# Patient Record
Sex: Female | Born: 1985 | Race: Black or African American | Hispanic: No | Marital: Single | Smoking: Former smoker
Health system: Southern US, Community
[De-identification: ages and names within clinical notes are randomized; demographics above are authoritative.]

## PROBLEM LIST (undated history)

## (undated) ENCOUNTER — Inpatient Hospital Stay (HOSPITAL_COMMUNITY): Payer: Self-pay

## (undated) DIAGNOSIS — I10 Essential (primary) hypertension: Secondary | ICD-10-CM

## (undated) DIAGNOSIS — O24419 Gestational diabetes mellitus in pregnancy, unspecified control: Secondary | ICD-10-CM

## (undated) HISTORY — PX: NO PAST SURGERIES: SHX2092

---

## 2017-05-29 ENCOUNTER — Emergency Department (HOSPITAL_COMMUNITY)
Admission: EM | Admit: 2017-05-29 | Discharge: 2017-05-29 | Disposition: A | Discharge status: Home / Self Care | Payer: BLUE CROSS/BLUE SHIELD | Attending: Emergency Medicine | Admitting: Emergency Medicine

## 2017-05-29 ENCOUNTER — Encounter (HOSPITAL_COMMUNITY): Payer: Self-pay | Admitting: Emergency Medicine

## 2017-05-29 DIAGNOSIS — F17200 Nicotine dependence, unspecified, uncomplicated: Secondary | ICD-10-CM | POA: Insufficient documentation

## 2017-05-29 DIAGNOSIS — Z3201 Encounter for pregnancy test, result positive: Secondary | ICD-10-CM | POA: Diagnosis present

## 2017-05-29 DIAGNOSIS — Z3490 Encounter for supervision of normal pregnancy, unspecified, unspecified trimester: Secondary | ICD-10-CM

## 2017-05-29 LAB — POC URINE PREG, ED: PREG TEST UR: POSITIVE — AB

## 2017-05-29 MED ORDER — PRENATAL COMPLETE 14-0.4 MG PO TABS: 0.4000 mg | ORAL_TABLET | Freq: Every day | ORAL | 0 refills | Status: AC

## 2017-05-29 NOTE — ED Triage Notes (Signed)
Pt sts positive home pregnancy test x 2; pt sts unsure of last period and hx of pregnancy x 1 in past with miscarriage; pt denies any other complaints

## 2017-05-29 NOTE — ED Provider Notes (Signed)
[facility] Provider Note   CSN: [id number] Arrival date & time: 05/29/17  [id number]     History   Chief Complaint Chief Complaint  Patient presents with  . Possible Pregnancy    HPI Kathryn Wilkins is a 31 y.o. female.  HPI  31 y.o. female, presents to the Emergency Department today requesting pregnancy test. Pt has had 2 home pregnancy tests. Noted hx in past with miscarriage last year. Denies N/V. No abdominal pain. No CP/SOB. No headache. No fevers. No visual changes. Unsure of LMP due to having irregular cycles. Pt states that she thinks her last menstrual was in April. No other symptoms noted.    History reviewed. No pertinent past medical history.  There are no active problems to display for this patient.   History reviewed. No pertinent surgical history.  OB History    No data available       Home Medications    Prior to Admission medications   Not on File    Family History History reviewed. No pertinent family history.  Social History Social History  Substance Use Topics  . Smoking status: Current Every Day Smoker  . Smokeless tobacco: Never Used  . Alcohol use No     Allergies   Patient has no known allergies.   Review of Systems Review of Systems ROS reviewed and all are negative for acute change except as noted in the HPI.  Physical Exam Updated Vital Signs BP 134/78 (BP Location: Right Arm)   Pulse 69   Temp 98.2 F (36.8 C) (Oral)   Resp 18   SpO2 100%   Physical Exam  Constitutional: She is oriented to person, place, and time. Vital signs are normal. She appears well-developed and well-nourished.  HENT:  Head: Normocephalic and atraumatic.  Right Ear: Hearing normal.  Left Ear: Hearing normal.  Eyes: Conjunctivae and EOM are normal. Pupils are equal, round, and reactive to light.  Neck: Normal range of motion. Neck supple.  Cardiovascular: Normal rate, regular rhythm, normal heart sounds and intact distal pulses.     Pulmonary/Chest: Effort normal and breath sounds normal.  Abdominal: Soft. There is no tenderness.  Abdomen soft/non tender. No palpable fundus  Musculoskeletal: Normal range of motion.  Neurological: She is alert and oriented to person, place, and time.  Skin: Skin is warm and dry.  Psychiatric: She has a normal mood and affect. Her speech is normal and behavior is normal. Thought content normal.  Nursing note and vitals reviewed.  ED Treatments / Results  Labs (all labs ordered are listed, but only abnormal results are displayed) Labs Reviewed  POC URINE PREG, ED - Abnormal; Notable for the following:       Result Value   Preg Test, Ur POSITIVE (*)    All other components within normal limits    EKG  EKG Interpretation None       Radiology No results found.  Procedures Procedures (including critical care time)  Medications Ordered in ED Medications - No data to display   Initial Impression / Assessment and Plan / ED Course  I have reviewed the triage vital signs and the nursing notes.  Pertinent labs & imaging results that were available during my care of the patient were reviewed by me and considered in my medical decision making (see chart for details).  Final Clinical Impressions(s) / ED Diagnoses  {I have reviewed and evaluated the relevant laboratory values.   {I have reviewed the relevant previous healthcare records.  {  I obtained HPI from historian. {Patient discussed with supervising physician.  ED Course:  Assessment: Pt is a 31 y.o. female with hx miscarriage who presents requesting pregnancy test. Noted positive 2 home pregnancy tests. No abdominal pain. No vaginal bleeding/discharge. On exam, pt in NAD. Nontoxic/nonseptic appearing. VSS. Afebrile. Lungs CTA. Heart RRR. Abdomen nontender soft. iStat HCG positive. Unsure of LMP due to irregular cycles. Pt thinks last cycle in April with some spotting. Discussed with attending provider. Will DC home with  follow up to OBGYN. As pt without abdominal pain and vaginal bleeding or symptoms, will have outpatient US done with OB to confirm gestation. Given Rx prenatal vitamins. At time of discharge, Patient is in no acute distress. Vital Signs are stable. Patient is able to ambulate. Patient able to tolerate PO.   Disposition/Plan:  DC Home Additional Verbal discharge instructions given and discussed with patient.  Pt Instructed to f/u with OBGYN in the next week for evaluation and treatment of symptoms. Return precautions given Pt acknowledges and agrees with plan  Supervising Physician Eber Hong, MD  Final diagnoses:  Pregnancy, unspecified gestational age    New Prescriptions New Prescriptions   No medications on file     Audry Pili, Cordelia Poche 05/29/17 1610    Eber Hong, MD 05/31/17 [id number]

## 2017-05-29 NOTE — Discharge Instructions (Signed)
Please read and follow all provided instructions.  Your diagnoses today include:  1. Pregnancy, unspecified gestational age     Tests performed today include: Vital signs. See below for your results today.   Medications prescribed:  Take as prescribed   Home care instructions:  Follow any educational materials contained in this packet.  Follow-up instructions: Please follow-up with OBGYN for further evaluation of symptoms and treatment   Return instructions:  Please return to the Emergency Department if you do not get better, if you get worse, or new symptoms OR  - Fever (temperature greater than 101.10F)  - Bleeding that does not stop with holding pressure to the area    -Severe pain (please note that you may be more sore the day after your accident)  - Chest Pain  - Difficulty breathing  - Severe nausea or vomiting  - Inability to tolerate food and liquids  - Passing out  - Skin becoming red around your wounds  - Change in mental status (confusion or lethargy)  - New numbness or weakness    Please return if you have any other emergent concerns.  Additional Information:  Your vital signs today were: BP 134/78 (BP Location: Right Arm)    Pulse 69    Temp 98.2 F (36.8 C) (Oral)    Resp 18    SpO2 100%  If your blood pressure (BP) was elevated above 135/85 this visit, please have this repeated by your doctor within one month. ---------------

## 2017-07-05 ENCOUNTER — Emergency Department (HOSPITAL_COMMUNITY)
Admission: EM | Admit: 2017-07-05 | Discharge: 2017-07-05 | Disposition: A | Discharge status: Home / Self Care | Payer: Self-pay | Attending: Emergency Medicine | Admitting: Emergency Medicine

## 2017-07-05 ENCOUNTER — Encounter (HOSPITAL_COMMUNITY): Payer: Self-pay

## 2017-07-05 ENCOUNTER — Emergency Department (HOSPITAL_COMMUNITY): Payer: Self-pay

## 2017-07-05 DIAGNOSIS — O0001 Abdominal pregnancy with intrauterine pregnancy: Secondary | ICD-10-CM | POA: Insufficient documentation

## 2017-07-05 DIAGNOSIS — R102 Pelvic and perineal pain: Secondary | ICD-10-CM

## 2017-07-05 DIAGNOSIS — F17210 Nicotine dependence, cigarettes, uncomplicated: Secondary | ICD-10-CM | POA: Insufficient documentation

## 2017-07-05 DIAGNOSIS — Z3A12 12 weeks gestation of pregnancy: Secondary | ICD-10-CM | POA: Insufficient documentation

## 2017-07-05 DIAGNOSIS — R1030 Lower abdominal pain, unspecified: Secondary | ICD-10-CM | POA: Insufficient documentation

## 2017-07-05 DIAGNOSIS — O99012 Anemia complicating pregnancy, second trimester: Secondary | ICD-10-CM | POA: Insufficient documentation

## 2017-07-05 LAB — WET PREP, GENITAL
Clue Cells Wet Prep HPF POC: NONE SEEN
Sperm: NONE SEEN
Trich, Wet Prep: NONE SEEN
WBC, Wet Prep HPF POC: NONE SEEN
Yeast Wet Prep HPF POC: NONE SEEN

## 2017-07-05 LAB — COMPREHENSIVE METABOLIC PANEL
ALT: 17 U/L (ref 14–54)
ANION GAP: 9 (ref 5–15)
AST: 21 U/L (ref 15–41)
Albumin: 3.5 g/dL (ref 3.5–5.0)
Alkaline Phosphatase: 55 U/L (ref 38–126)
BILIRUBIN TOTAL: 0.3 mg/dL (ref 0.3–1.2)
BUN: <5 mg/dL — ABNORMAL LOW (ref 6–20)
CHLORIDE: 105 mmol/L (ref 101–111)
CO2: 20 mmol/L — ABNORMAL LOW (ref 22–32)
Calcium: 9.4 mg/dL (ref 8.9–10.3)
Creatinine, Ser: 0.66 mg/dL (ref 0.44–1.00)
GFR calc Af Amer: >60 mL/min (ref >60)
Glucose, Bld: 118 mg/dL — ABNORMAL HIGH (ref 65–99)
POTASSIUM: 3.7 mmol/L (ref 3.5–5.1)
Sodium: 134 mmol/L — ABNORMAL LOW (ref 135–145)
TOTAL PROTEIN: 7.1 g/dL (ref 6.5–8.1)

## 2017-07-05 LAB — HCG, QUANTITATIVE, PREGNANCY: hCG, Beta Chain, Quant, S: 164690 m[IU]/mL — ABNORMAL HIGH (ref <5)

## 2017-07-05 LAB — CBC
HEMATOCRIT: 34.7 % — AB (ref 36.0–46.0)
Hemoglobin: 11.5 g/dL — ABNORMAL LOW (ref 12.0–15.0)
MCH: 21.5 pg — ABNORMAL LOW (ref 26.0–34.0)
MCHC: 33.1 g/dL (ref 30.0–36.0)
MCV: 64.7 fL — AB (ref 78.0–100.0)
Platelets: 240 10*3/uL (ref 150–400)
RBC: 5.36 MIL/uL — ABNORMAL HIGH (ref 3.87–5.11)
RDW: 17.4 % — AB (ref 11.5–15.5)
WBC: 10.1 10*3/uL (ref 4.0–10.5)

## 2017-07-05 LAB — URINALYSIS, ROUTINE W REFLEX MICROSCOPIC
Bilirubin Urine: NEGATIVE
GLUCOSE, UA: NEGATIVE mg/dL
Hgb urine dipstick: NEGATIVE
KETONES UR: 20 mg/dL — AB
Leukocytes, UA: NEGATIVE
NITRITE: NEGATIVE
PH: 7 (ref 5.0–8.0)
Protein, ur: NEGATIVE mg/dL
SPECIFIC GRAVITY, URINE: 1.015 (ref 1.005–1.030)

## 2017-07-05 LAB — OB RESULTS CONSOLE GC/CHLAMYDIA: GC PROBE AMP, GENITAL: NEGATIVE

## 2017-07-05 LAB — LIPASE, BLOOD: LIPASE: 19 U/L (ref 11–51)

## 2017-07-05 LAB — PREGNANCY, URINE: Preg Test, Ur: POSITIVE — AB

## 2017-07-05 MED ORDER — ACETAMINOPHEN 325 MG PO TABS
650.0000 mg | ORAL_TABLET | Freq: Once | ORAL | Status: DC
  Administered 2017-07-05: 650 mg via ORAL
  Filled 2017-07-05: qty 2

## 2017-07-05 NOTE — Discharge Instructions (Signed)
You can take Tylenol as prescribed over-the-counter, as needed for your pain. Continue taking your prenatal vitamins. Sure to stay well-hydrated and drink plenty of water. Please follow-up with the OB/GYN as soon as possible for further evaluation and treatment. Please return to the emergency department or [facility] if you develop any new or worsening symptoms.

## 2017-07-05 NOTE — ED Triage Notes (Signed)
Pt reports she is currently pregnant, LMP she cant remember. She estimates in April. She found out she was pregnant in June. Pt has not idea how far along she is and has not yet been to the OB. She reports pelvic pain and pressure that started this morning and reports "dripping" when she feels like she has to urinate. This is her first pregnancy. Denies bleeding, denies back pain.

## 2017-07-05 NOTE — ED Notes (Signed)
Pt in bathroom x 2, to try and get urine. Has been dribbling urine in bed.

## 2017-07-05 NOTE — ED Notes (Signed)
Pt to US.

## 2017-07-05 NOTE — ED Provider Notes (Signed)
[facility] Provider Note   CSN: [id number] Arrival date & time: 07/05/17  1121     History   Chief Complaint Chief Complaint  Patient presents with  . Pelvic Pain    HPI Kathryn Wilkins is a 31 y.o. female who presents with suprapubic pressure and pain that began this morning. Patient feels a pressure in her bladder. She was able to urinate without difficulty earlier today. She denied any dysuria. She has had urgency. Patient is previously healthy and pregnant with LMP around April. Patient was diagnosed with U preg here in June. Patient's menstrual cycle is irregular and she cannot be sure of last menstrual cycle. She has not seen OB/GYN yet. She denies any abnormal vaginal bleeding or discharge. She denies any other abdominal pain, nausea, vomiting, fevers, chest pain, shortness of breath. She did not take any medications prior to arrival.  HPI  History reviewed. No pertinent past medical history.  There are no active problems to display for this patient.   History reviewed. No pertinent surgical history.  OB History    Gravida Para Term Preterm AB Living   1             SAB TAB Ectopic Multiple Live Births                   Home Medications    Prior to Admission medications   Medication Sig Start Date End Date Taking? Authorizing Provider  Prenatal Vit-Fe Fumarate-FA (PRENATAL COMPLETE) 14-0.4 MG TABS Take 0.4 mg by mouth daily. 05/29/17  Yes Audry Pili, PA-C    Family History No family history on file.  Social History Social History  Substance Use Topics  . Smoking status: Current Every Day Smoker  . Smokeless tobacco: Never Used  . Alcohol use No     Allergies   Patient has no known allergies.   Review of Systems Review of Systems  Constitutional: Negative for chills and fever.  HENT: Negative for facial swelling and sore throat.   Respiratory: Negative for shortness of breath.   Cardiovascular: Negative for chest pain.  Gastrointestinal:  Positive for abdominal pain. Negative for nausea and vomiting.  Genitourinary: Positive for pelvic pain. Negative for dysuria, vaginal bleeding and vaginal discharge.  Musculoskeletal: Negative for back pain.  Skin: Negative for rash and wound.  Neurological: Negative for headaches.  Psychiatric/Behavioral: The patient is not nervous/anxious.      Physical Exam Updated Vital Signs BP 124/63   Pulse 70   Temp 98.7 F (37.1 C)   Resp 16   LMP 04/05/2017   SpO2 98%   Physical Exam  Constitutional: She appears well-developed and well-nourished. No distress.  HENT:  Head: Normocephalic and atraumatic.  Mouth/Throat: Oropharynx is clear and moist. No oropharyngeal exudate.  Eyes: Pupils are equal, round, and reactive to light. Conjunctivae are normal. Right eye exhibits no discharge. Left eye exhibits no discharge. No scleral icterus.  Neck: Normal range of motion. Neck supple. No thyromegaly present.  Cardiovascular: Normal rate, regular rhythm, normal heart sounds and intact distal pulses.  Exam reveals no gallop and no friction rub.   No murmur heard. Pulmonary/Chest: Effort normal and breath sounds normal. No stridor. No respiratory distress. She has no wheezes. She has no rales.  Abdominal: Soft. Bowel sounds are normal. She exhibits no distension. There is tenderness in the suprapubic area. There is no rebound and no guarding.  Genitourinary:  Genitourinary Comments: Chaperone present; limited speculum exam; no significant discharge; no adnexal  tenderness or bleeding; some possible vaginal prolapse felt on posterior aspect  Musculoskeletal: She exhibits no edema.  Lymphadenopathy:    She has no cervical adenopathy.  Neurological: She is alert. Coordination normal.  Skin: Skin is warm and dry. No rash noted. She is not diaphoretic. No pallor.  Psychiatric: She has a normal mood and affect.  Nursing note and vitals reviewed.    ED Treatments / Results  Labs (all labs ordered  are listed, but only abnormal results are displayed) Labs Reviewed  COMPREHENSIVE METABOLIC PANEL - Abnormal; Notable for the following:       Result Value   Sodium 134 (*)    CO2 20 (*)    Glucose, Bld 118 (*)    BUN <5 (*)    All other components within normal limits  CBC - Abnormal; Notable for the following:    RBC 5.36 (*)    Hemoglobin 11.5 (*)    HCT 34.7 (*)    MCV 64.7 (*)    MCH 21.5 (*)    RDW 17.4 (*)    All other components within normal limits  URINALYSIS, ROUTINE W REFLEX MICROSCOPIC - Abnormal; Notable for the following:    APPearance HAZY (*)    Ketones, ur 20 (*)    All other components within normal limits  HCG, QUANTITATIVE, PREGNANCY - Abnormal; Notable for the following:    hCG, Beta Chain, Quant, S 164,690 (*)    All other components within normal limits  PREGNANCY, URINE - Abnormal; Notable for the following:    Preg Test, Ur POSITIVE (*)    All other components within normal limits  WET PREP, GENITAL  LIPASE, BLOOD  GC/CHLAMYDIA PROBE AMP ([place]) NOT AT [facility]    EKG  EKG Interpretation None       Radiology US Ob Comp < 14 Wks  Result Date: 07/05/2017 CLINICAL DATA:  Bladder fullness. EXAM: OBSTETRIC <14 WK [place] AND TRANSVAGINAL OB US TECHNIQUE: Both transabdominal and transvaginal ultrasound examinations were performed for complete evaluation of the gestation as well as the maternal uterus, adnexal regions, and pelvic cul-de-sac. Transvaginal technique was performed to assess early pregnancy. COMPARISON:  No prior . FINDINGS: Intrauterine gestational sac: Single Yolk sac:  Not visualized Embryo:  Visualized Cardiac Activity: Visualized Heart Rate: 135  bpm CRL:  63.8  mm   12 w   5 d                  [place] EDC: 01/12/2018 Subchorionic hemorrhage:  None visualized. Maternal uterus/adnexae: 2 cm complex cyst, most likely corpus luteal. IMPRESSION: Single viable intrauterine pregnancy at 12 weeks 5 days. Electronically Signed   By: Maisie Fus  Register    On: 07/05/2017 16:12   US Ob Transvaginal  Result Date: 07/05/2017 CLINICAL DATA:  Bladder fullness. EXAM: OBSTETRIC <14 WK [place] AND TRANSVAGINAL OB US TECHNIQUE: Both transabdominal and transvaginal ultrasound examinations were performed for complete evaluation of the gestation as well as the maternal uterus, adnexal regions, and pelvic cul-de-sac. Transvaginal technique was performed to assess early pregnancy. COMPARISON:  No prior . FINDINGS: Intrauterine gestational sac: Single Yolk sac:  Not visualized Embryo:  Visualized Cardiac Activity: Visualized Heart Rate: 135  bpm CRL:  63.8  mm   12 w   5 d                  [place] EDC: 01/12/2018 Subchorionic hemorrhage:  None visualized. Maternal uterus/adnexae: 2 cm complex cyst, most likely corpus luteal. IMPRESSION:  Single viable intrauterine pregnancy at 12 weeks 5 days. Electronically Signed   By: Maisie Fushomas  Register   On: 07/05/2017 16:12    Procedures Procedures (including critical care time)  Medications Ordered in ED Medications - No data to display   Initial Impression / Assessment and Plan / ED Course  I have reviewed the triage vital signs and the nursing notes.  Pertinent labs & imaging results that were available during my care of the patient were reviewed by me and considered in my medical decision making (see chart for details).     CBC shows mild anemia, hemoglobin 11.5. CMP unremarkable. Lipase 19. HCG 164,690. UA is negative for infection. Wet prep is negative. Pelvic ultrasound shows single viable intrauterine pregnancy at 12 weeks, 5 days. Patient's symptoms improved in the ED without intervention. She reports continued, mild intermittent pains and pressure. Patient to follow-up with OB/GYN at scheduled appointment in one week or sooner, if possible. Return precautions discussed. Patient understands and agrees with plan. Patient vitals stable throughout ED course and discharged in satisfactory condition. I discussed patient case with  Dr. Clarene DukeLittle who guided the patient's management and agrees with plan.   Final Clinical Impressions(s) / ED Diagnoses   Final diagnoses:  Suprapubic pressure    New Prescriptions New Prescriptions   No medications on file     Verdis PrimeLaw, Mckayla Mulcahey M, PA-C 07/05/17 1624    Little, Ambrose Finlandachel Morgan, MD 07/06/17 1733

## 2017-07-05 NOTE — ED Notes (Signed)
Called about hcg-quant technician reports in process, will check on it and expedite results.

## 2017-07-06 LAB — GC/CHLAMYDIA PROBE AMP (~~LOC~~) NOT AT ARMC
Chlamydia: NEGATIVE
Neisseria Gonorrhea: NEGATIVE

## 2017-07-11 LAB — URINE CULTURE

## 2017-07-11 LAB — OB RESULTS CONSOLE RPR: RPR: NONREACTIVE

## 2017-07-14 ENCOUNTER — Encounter (HOSPITAL_COMMUNITY): Payer: Self-pay | Admitting: *Deleted

## 2017-07-14 ENCOUNTER — Inpatient Hospital Stay (HOSPITAL_COMMUNITY)
Admission: AD | Admit: 2017-07-14 | Discharge: 2017-07-14 | Disposition: A | Discharge status: Home / Self Care | Payer: Medicaid Other | Source: Ambulatory Visit | Attending: Obstetrics and Gynecology | Admitting: Obstetrics and Gynecology

## 2017-07-14 DIAGNOSIS — O3680X1 Pregnancy with inconclusive fetal viability, fetus 1: Secondary | ICD-10-CM | POA: Diagnosis not present

## 2017-07-14 DIAGNOSIS — O26891 Other specified pregnancy related conditions, first trimester: Secondary | ICD-10-CM | POA: Diagnosis present

## 2017-07-14 DIAGNOSIS — Z3A14 14 weeks gestation of pregnancy: Secondary | ICD-10-CM | POA: Diagnosis not present

## 2017-07-14 LAB — CYTOLOGY - PAP
CYSTIC FIBROSIS PROFILE: NEGATIVE
PAP SMEAR: NEGATIVE

## 2017-07-14 LAB — GLUCOSE, 1 HOUR: Glucose, GTT - 1 Hour: 153 (ref ?–200)

## 2017-07-14 LAB — OB RESULTS CONSOLE ABO/RH: RH Type: POSITIVE

## 2017-07-14 LAB — OB RESULTS CONSOLE ANTIBODY SCREEN: Antibody Screen: NEGATIVE

## 2017-07-14 LAB — OB RESULTS CONSOLE VARICELLA ZOSTER ANTIBODY, IGG: Varicella: IMMUNE

## 2017-07-14 LAB — OB RESULTS CONSOLE HIV ANTIBODY (ROUTINE TESTING): HIV: NONREACTIVE

## 2017-07-14 LAB — OB RESULTS CONSOLE RUBELLA ANTIBODY, IGM: Rubella: IMMUNE

## 2017-07-14 NOTE — MAU Provider Note (Signed)
   CC: No DT FHR at [facility]  SUBJECTIVE:  Kathryn Wilkins is a 31 y.o. G1P0 at 6975w0d by ultrasound at 12 weeks 5 days [facility] was unable hear fetal heart with Doppler at routine PNV. Patient has no complaints and pregnancy significant for obesity.  OBJECTIVE: Vitals:   07/14/17 1320  Pulse: (!) 115  Resp: 18    Gen: NAD Bedside US> normal FHR and fetal movement observed  ASSESSMENT: G1 at 6975w0d viable IUP  PLAN: Discharge home with reassurance and pregnancy precautions Allergies as of 07/14/2017   No Known Allergies     Medication List    TAKE these medications   PRENATAL COMPLETE 14-0.4 MG Tabs Take 0.4 mg by mouth daily.      Follow-up Information    [facility] Schedule an appointment as soon as possible for a visit in 4 week(s).   Contact information: [address] [place] [id number] [phone]         Danae Orleansoe, Siomara Burkel C, CNM 07/14/2017 1:43 PM

## 2017-07-14 NOTE — MAU Note (Signed)
Pt sent from health dept. Unable to doppler hear tones. Bedside u/s observed movement and Heart beat.

## 2017-07-14 NOTE — Discharge Instructions (Signed)
First Trimester of Pregnancy The first trimester of pregnancy is from week 1 until the end of week 13 (months 1 through 3). A week after a sperm fertilizes an egg, the egg will implant on the wall of the uterus. This embryo will begin to develop into a baby. Genes from you and your partner will form the baby. The female genes will determine whether the baby will be a boy or a girl. At 6-8 weeks, the eyes and face will be formed, and the heartbeat can be seen on ultrasound. At the end of 12 weeks, all the baby's organs will be formed. Now that you are pregnant, you will want to do everything you can to have a healthy baby. Two of the most important things are to get good prenatal care and to follow your health care provider's instructions. Prenatal care is all the medical care you receive before the baby's birth. This care will help prevent, find, and treat any problems during the pregnancy and childbirth. Body changes during your first trimester Your body goes through many changes during pregnancy. The changes vary from woman to woman.  You may gain or lose a couple of pounds at first.  You may feel sick to your stomach (nauseous) and you may throw up (vomit). If the vomiting is uncontrollable, call your health care provider.  You may tire easily.  You may develop headaches that can be relieved by medicines. All medicines should be approved by your health care provider.  You may urinate more often. Painful urination may mean you have a bladder infection.  You may develop heartburn as a result of your pregnancy.  You may develop constipation because certain hormones are causing the muscles that push stool through your intestines to slow down.  You may develop hemorrhoids or swollen veins (varicose veins).  Your breasts may begin to grow larger and become tender. Your nipples may stick out more, and the tissue that surrounds them (areola) may become darker.  Your gums may bleed and may be  sensitive to brushing and flossing.  Dark spots or blotches (chloasma, mask of pregnancy) may develop on your face. This will likely fade after the baby is born.  Your menstrual periods will stop.  You may have a loss of appetite.  You may develop cravings for certain kinds of food.  You may have changes in your emotions from day to day, such as being excited to be pregnant or being concerned that something may go wrong with the pregnancy and baby.  You may have more vivid and strange dreams.  You may have changes in your hair. These can include thickening of your hair, rapid growth, and changes in texture. Some women also have hair loss during or after pregnancy, or hair that feels dry or thin. Your hair will most likely return to normal after your baby is born.  What to expect at prenatal visits During a routine prenatal visit:  You will be weighed to make sure you and the baby are growing normally.  Your blood pressure will be taken.  Your abdomen will be measured to track your baby's growth.  The fetal heartbeat will be listened to between weeks 10 and 14 of your pregnancy.  Test results from any previous visits will be discussed.  Your health care provider may ask you:  How you are feeling.  If you are feeling the baby move.  If you have had any abnormal symptoms, such as leaking fluid, bleeding, severe headaches,   or abdominal cramping.  If you are using any tobacco products, including cigarettes, chewing tobacco, and electronic cigarettes.  If you have any questions.  Other tests that may be performed during your first trimester include:  Blood tests to find your blood type and to check for the presence of any previous infections. The tests will also be used to check for low iron levels (anemia) and protein on red blood cells (Rh antibodies). Depending on your risk factors, or if you previously had diabetes during pregnancy, you may have tests to check for high blood  sugar that affects pregnant women (gestational diabetes).  Urine tests to check for infections, diabetes, or protein in the urine.  An ultrasound to confirm the proper growth and development of the baby.  Fetal screens for spinal cord problems (spina bifida) and Down syndrome.  HIV (human immunodeficiency virus) testing. Routine prenatal testing includes screening for HIV, unless you choose not to have this test.  You may need other tests to make sure you and the baby are doing well.  Follow these instructions at home: Medicines  Follow your health care provider's instructions regarding medicine use. Specific medicines may be either safe or unsafe to take during pregnancy.  Take a prenatal vitamin that contains at least 600 micrograms (mcg) of folic acid.  If you develop constipation, try taking a stool softener if your health care provider approves. Eating and drinking  Eat a balanced diet that includes fresh fruits and vegetables, whole grains, good sources of protein such as meat, eggs, or tofu, and low-fat dairy. Your health care provider will help you determine the amount of weight gain that is right for you.  Avoid raw meat and uncooked cheese. These carry germs that can cause birth defects in the baby.  Eating four or five small meals rather than three large meals a day may help relieve nausea and vomiting. If you start to feel nauseous, eating a few soda crackers can be helpful. Drinking liquids between meals, instead of during meals, also seems to help ease nausea and vomiting.  Limit foods that are high in fat and processed sugars, such as fried and sweet foods.  To prevent constipation: ? Eat foods that are high in fiber, such as fresh fruits and vegetables, whole grains, and beans. ? Drink enough fluid to keep your urine clear or pale yellow. Activity  Exercise only as directed by your health care provider. Most women can continue their usual exercise routine during  pregnancy. Try to exercise for 30 minutes at least 5 days a week. Exercising will help you: ? Control your weight. ? Stay in shape. ? Be prepared for labor and delivery.  Experiencing pain or cramping in the lower abdomen or lower back is a good sign that you should stop exercising. Check with your health care provider before continuing with normal exercises.  Try to avoid standing for long periods of time. Move your legs often if you must stand in one place for a long time.  Avoid heavy lifting.  Wear low-heeled shoes and practice good posture.  You may continue to have sex unless your health care provider tells you not to. Relieving pain and discomfort  Wear a good support bra to relieve breast tenderness.  Take warm sitz baths to soothe any pain or discomfort caused by hemorrhoids. Use hemorrhoid cream if your health care provider approves.  Rest with your legs elevated if you have leg cramps or low back pain.  If you develop   varicose veins in your legs, wear support hose. Elevate your feet for 15 minutes, 3-4 times a day. Limit salt in your diet. Prenatal care  Schedule your prenatal visits by the twelfth week of pregnancy. They are usually scheduled monthly at first, then more often in the last 2 months before delivery.  Write down your questions. Take them to your prenatal visits.  Keep all your prenatal visits as told by your health care provider. This is important. Safety  Wear your seat belt at all times when driving.  Make a list of emergency phone numbers, including numbers for family, friends, the hospital, and police and fire departments. General instructions  Ask your health care provider for a referral to a local prenatal education class. Begin classes no later than the beginning of month 6 of your pregnancy.  Ask for help if you have counseling or nutritional needs during pregnancy. Your health care provider can offer advice or refer you to specialists for help  with various needs.  Do not use hot tubs, steam rooms, or saunas.  Do not douche or use tampons or scented sanitary pads.  Do not cross your legs for long periods of time.  Avoid cat litter boxes and soil used by cats. These carry germs that can cause birth defects in the baby and possibly loss of the fetus by miscarriage or stillbirth.  Avoid all smoking, herbs, alcohol, and medicines not prescribed by your health care provider. Chemicals in these products affect the formation and growth of the baby.  Do not use any products that contain nicotine or tobacco, such as cigarettes and e-cigarettes. If you need help quitting, ask your health care provider. You may receive counseling support and other resources to help you quit.  Schedule a dentist appointment. At home, brush your teeth with a soft toothbrush and be gentle when you floss. Contact a health care provider if:  You have dizziness.  You have mild pelvic cramps, pelvic pressure, or nagging pain in the abdominal area.  You have persistent nausea, vomiting, or diarrhea.  You have a bad smelling vaginal discharge.  You have pain when you urinate.  You notice increased swelling in your face, hands, legs, or ankles.  You are exposed to fifth disease or chickenpox.  You are exposed to German measles (rubella) and have never had it. Get help right away if:  You have a fever.  You are leaking fluid from your vagina.  You have spotting or bleeding from your vagina.  You have severe abdominal cramping or pain.  You have rapid weight gain or loss.  You vomit blood or material that looks like coffee grounds.  You develop a severe headache.  You have shortness of breath.  You have any kind of trauma, such as from a fall or a car accident. Summary  The first trimester of pregnancy is from week 1 until the end of week 13 (months 1 through 3).  Your body goes through many changes during pregnancy. The changes vary from  woman to woman.  You will have routine prenatal visits. During those visits, your health care provider will examine you, discuss any test results you may have, and talk with you about how you are feeling. This information is not intended to replace advice given to you by your health care provider. Make sure you discuss any questions you have with your health care provider. Document Released: 11/30/2001 Document Revised: 11/17/2016 Document Reviewed: 11/17/2016 [employer] Interactive Patient Education  2017 [employer]  

## 2017-07-18 LAB — GLUCOSE, 3 HOUR

## 2017-07-25 ENCOUNTER — Encounter: Payer: Self-pay | Admitting: *Deleted

## 2017-07-25 DIAGNOSIS — O0930 Supervision of pregnancy with insufficient antenatal care, unspecified trimester: Secondary | ICD-10-CM

## 2017-07-25 DIAGNOSIS — O0990 Supervision of high risk pregnancy, unspecified, unspecified trimester: Secondary | ICD-10-CM

## 2017-07-25 DIAGNOSIS — O24410 Gestational diabetes mellitus in pregnancy, diet controlled: Secondary | ICD-10-CM | POA: Insufficient documentation

## 2017-07-28 ENCOUNTER — Encounter: Payer: Self-pay | Admitting: *Deleted

## 2017-07-28 ENCOUNTER — Ambulatory Visit: Payer: Self-pay | Admitting: *Deleted

## 2017-07-28 ENCOUNTER — Encounter: Payer: Medicaid Other | Attending: Obstetrics and Gynecology | Admitting: *Deleted

## 2017-07-28 DIAGNOSIS — Z713 Dietary counseling and surveillance: Secondary | ICD-10-CM | POA: Insufficient documentation

## 2017-07-28 DIAGNOSIS — R7302 Impaired glucose tolerance (oral): Secondary | ICD-10-CM | POA: Diagnosis not present

## 2017-07-28 DIAGNOSIS — R7309 Other abnormal glucose: Secondary | ICD-10-CM

## 2017-07-28 LAB — CULTURE, OB URINE

## 2017-07-28 NOTE — Progress Notes (Signed)
  Patient was seen on 07/28/2017 for Gestational Diabetes self-management. Diet history obtained and appears appropriate. Patient states she is walking most days for 15-30 minutes.  The following learning objectives were met by the patient :   States the definition of Gestational Diabetes  States why dietary management is important in controlling blood glucose  Describes the effects of carbohydrates on blood glucose levels  Demonstrates ability to create a balanced meal plan  Demonstrates carbohydrate counting   States when to check blood glucose levels  Demonstrates proper blood glucose monitoring techniques  States the effect of stress and exercise on blood glucose levels  States the importance of limiting caffeine and abstaining from alcohol and smoking  Plan:  Aim for 3 Carb Choices per meal (45 grams) +/- 1 either way  Aim for 1-2 Carbs per snack Begin reading food labels for Total Carbohydrate of foods Consider  increasing your activity level by walking or other activity daily as tolerated Begin checking BG before breakfast and 2 hours after first bite of breakfast, lunch and dinner as directed by MD  Bring Log Book to every medical appointment   Take medication if directed by MD  Blood glucose monitor given: [employer] Track Lot # [id number] Exp: 04/09/2019 Blood glucose reading: 88 mg/dl  Patient informed of [employer] and is interested in enrolling . She understands to put BG into APP at the time she tests, not at end of the day.  Patient instructed to monitor glucose levels: FBS: 60 - 95 mg/dl 2 hour: <120 mg/dl  Patient received the following handouts:  Nutrition Diabetes and Pregnancy  Carbohydrate Counting List  Enrolled patient into [employer]  Patient will be seen for follow-up as needed.

## 2017-08-04 ENCOUNTER — Encounter: Payer: Self-pay | Admitting: Obstetrics and Gynecology

## 2017-08-04 NOTE — Progress Notes (Signed)
Patient did not keep new OB appointment for 08/04/2017.  Kathryn Wilkins, Jr MD [facility]ulty [facility])

## 2017-08-12 ENCOUNTER — Ambulatory Visit (INDEPENDENT_AMBULATORY_CARE_PROVIDER_SITE_OTHER): Payer: Medicaid Other | Admitting: Obstetrics and Gynecology

## 2017-08-12 ENCOUNTER — Encounter: Payer: Self-pay | Admitting: Obstetrics and Gynecology

## 2017-08-12 VITALS — BP 131/76 | HR 88 | Wt 202.2 lb

## 2017-08-12 DIAGNOSIS — O0990 Supervision of high risk pregnancy, unspecified, unspecified trimester: Secondary | ICD-10-CM

## 2017-08-12 DIAGNOSIS — O0992 Supervision of high risk pregnancy, unspecified, second trimester: Secondary | ICD-10-CM

## 2017-08-12 DIAGNOSIS — O24410 Gestational diabetes mellitus in pregnancy, diet controlled: Secondary | ICD-10-CM

## 2017-08-12 LAB — POCT URINALYSIS DIP (DEVICE)
BILIRUBIN URINE: NEGATIVE
Glucose, UA: NEGATIVE mg/dL
KETONES UR: NEGATIVE mg/dL
NITRITE: NEGATIVE
PH: 6 (ref 5.0–8.0)
Protein, ur: 100 mg/dL — AB
Specific Gravity, Urine: 1.025 (ref 1.005–1.030)
Urobilinogen, UA: 0.2 mg/dL (ref 0.0–1.0)

## 2017-08-12 NOTE — Progress Notes (Signed)
Patient here for New OB visit 18w 1d

## 2017-08-12 NOTE — Patient Instructions (Addendum)
Second Trimester of Pregnancy The second trimester is from week 14 through week 27 (months 4 through 6). The second trimester is often a time when you feel your best. Your body has adjusted to being pregnant, and you begin to feel better physically. Usually, morning sickness has lessened or quit completely, you may have more energy, and you may have an increase in appetite. The second trimester is also a time when the fetus is growing rapidly. At the end of the sixth month, the fetus is about 9 inches long and weighs about 1 pounds. You will likely begin to feel the baby move (quickening) between 16 and 20 weeks of pregnancy. Body changes during your second trimester Your body continues to go through many changes during your second trimester. The changes vary from woman to woman.  Your weight will continue to increase. You will notice your lower abdomen bulging out.  You may begin to get stretch marks on your hips, abdomen, and breasts.  You may develop headaches that can be relieved by medicines. The medicines should be approved by your health care provider.  You may urinate more often because the fetus is pressing on your bladder.  You may develop or continue to have heartburn as a result of your pregnancy.  You may develop constipation because certain hormones are causing the muscles that push waste through your intestines to slow down.  You may develop hemorrhoids or swollen, bulging veins (varicose veins).  You may have back pain. This is caused by: ? Weight gain. ? Pregnancy hormones that are relaxing the joints in your pelvis. ? A shift in weight and the muscles that support your balance.  Your breasts will continue to grow and they will continue to become tender.  Your gums may bleed and may be sensitive to brushing and flossing.  Dark spots or blotches (chloasma, mask of pregnancy) may develop on your face. This will likely fade after the baby is born.  A dark line from your  belly button to the pubic area (linea nigra) may appear. This will likely fade after the baby is born.  You may have changes in your hair. These can include thickening of your hair, rapid growth, and changes in texture. Some women also have hair loss during or after pregnancy, or hair that feels dry or thin. Your hair will most likely return to normal after your baby is born.  What to expect at prenatal visits During a routine prenatal visit:  You will be weighed to make sure you and the fetus are growing normally.  Your blood pressure will be taken.  Your abdomen will be measured to track your baby's growth.  The fetal heartbeat will be listened to.  Any test results from the previous visit will be discussed.  Your health care provider may ask you:  How you are feeling.  If you are feeling the baby move.  If you have had any abnormal symptoms, such as leaking fluid, bleeding, severe headaches, or abdominal cramping.  If you are using any tobacco products, including cigarettes, chewing tobacco, and electronic cigarettes.  If you have any questions.  Other tests that may be performed during your second trimester include:  Blood tests that check for: ? Low iron levels (anemia). ? High blood sugar that affects pregnant women (gestational diabetes) between 24 and 28 weeks. ? Rh antibodies. This is to check for a protein on red blood cells (Rh factor).  Urine tests to check for infections, diabetes, or   protein in the urine.  An ultrasound to confirm the proper growth and development of the baby.  An amniocentesis to check for possible genetic problems.  Fetal screens for spina bifida and Down syndrome.  HIV (human immunodeficiency virus) testing. Routine prenatal testing includes screening for HIV, unless you choose not to have this test.  Follow these instructions at home: Medicines  Follow your health care provider's instructions regarding medicine use. Specific  medicines may be either safe or unsafe to take during pregnancy.  Take a prenatal vitamin that contains at least 600 micrograms (mcg) of folic acid.  If you develop constipation, try taking a stool softener if your health care provider approves. Eating and drinking  Eat a balanced diet that includes fresh fruits and vegetables, whole grains, good sources of protein such as meat, eggs, or tofu, and low-fat dairy. Your health care provider will help you determine the amount of weight gain that is right for you.  Avoid raw meat and uncooked cheese. These carry germs that can cause birth defects in the baby.  If you have low calcium intake from food, talk to your health care provider about whether you should take a daily calcium supplement.  Limit foods that are high in fat and processed sugars, such as fried and sweet foods.  To prevent constipation: ? Drink enough fluid to keep your urine clear or pale yellow. ? Eat foods that are high in fiber, such as fresh fruits and vegetables, whole grains, and beans. Activity  Exercise only as directed by your health care provider. Most women can continue their usual exercise routine during pregnancy. Try to exercise for 30 minutes at least 5 days a week. Stop exercising if you experience uterine contractions.  Avoid heavy lifting, wear low heel shoes, and practice good posture.  A sexual relationship may be continued unless your health care provider directs you otherwise. Relieving pain and discomfort  Wear a good support bra to prevent discomfort from breast tenderness.  Take warm sitz baths to soothe any pain or discomfort caused by hemorrhoids. Use hemorrhoid cream if your health care provider approves.  Rest with your legs elevated if you have leg cramps or low back pain.  If you develop varicose veins, wear support hose. Elevate your feet for 15 minutes, 3-4 times a day. Limit salt in your diet. Prenatal Care  Write down your questions.  Take them to your prenatal visits.  Keep all your prenatal visits as told by your health care provider. This is important. Safety  Wear your seat belt at all times when driving.  Make a list of emergency phone numbers, including numbers for family, friends, the hospital, and police and fire departments. General instructions  Ask your health care provider for a referral to a local prenatal education class. Begin classes no later than the beginning of month 6 of your pregnancy.  Ask for help if you have counseling or nutritional needs during pregnancy. Your health care provider can offer advice or refer you to specialists for help with various needs.  Do not use hot tubs, steam rooms, or saunas.  Do not douche or use tampons or scented sanitary pads.  Do not cross your legs for long periods of time.  Avoid cat litter boxes and soil used by cats. These carry germs that can cause birth defects in the baby and possibly loss of the fetus by miscarriage or stillbirth.  Avoid all smoking, herbs, alcohol, and unprescribed drugs. Chemicals in these products can   affect the formation and growth of the baby.  Do not use any products that contain nicotine or tobacco, such as cigarettes and e-cigarettes. If you need help quitting, ask your health care provider.  Visit your dentist if you have not gone yet during your pregnancy. Use a soft toothbrush to brush your teeth and be gentle when you floss. Contact a health care provider if:  You have dizziness.  You have mild pelvic cramps, pelvic pressure, or nagging pain in the abdominal area.  You have persistent nausea, vomiting, or diarrhea.  You have a bad smelling vaginal discharge.  You have pain when you urinate. Get help right away if:  You have a fever.  You are leaking fluid from your vagina.  You have spotting or bleeding from your vagina.  You have severe abdominal cramping or pain.  You have rapid weight gain or weight  loss.  You have shortness of breath with chest pain.  You notice sudden or extreme swelling of your face, hands, ankles, feet, or legs.  You have not felt your baby move in over an hour.  You have severe headaches that do not go away when you take medicine.  You have vision changes. Summary  The second trimester is from week 14 through week 27 (months 4 through 6). It is also a time when the fetus is growing rapidly.  Your body goes through many changes during pregnancy. The changes vary from woman to woman.  Avoid all smoking, herbs, alcohol, and unprescribed drugs. These chemicals affect the formation and growth your baby.  Do not use any tobacco products, such as cigarettes, chewing tobacco, and e-cigarettes. If you need help quitting, ask your health care provider.  Contact your health care provider if you have any questions. Keep all prenatal visits as told by your health care provider. This is important. This information is not intended to replace advice given to you by your health care provider. Make sure you discuss any questions you have with your health care provider. Document Released: 11/30/2001 Document Revised: 05/13/2016 Document Reviewed: 02/06/2013 Elsevier Interactive Patient Education  2017 [employer]  Gestational Diabetes Mellitus, Diagnosis Gestational diabetes (gestational diabetes mellitus) is a temporary form of diabetes that some women develop during pregnancy. It usually occurs around weeks 24-28 of pregnancy and goes away after delivery. Hormonal changes during pregnancy can interfere with insulin production and function, which may result in one or both of these problems:  The pancreas does not make enough of a hormone called insulin.  Cells in the body do not respond properly to insulin that the body makes (insulin resistance).  Normally, insulin allows sugars (glucose) to enter cells in the body. The cells use glucose for energy. Insulin resistance or  lack of insulin causes excess glucose to build up in the blood instead of going into cells. As a result, high blood glucose (hyperglycemia) develops. What are the risks? If gestational diabetes is treated, it is unlikely to cause problems. If it is not controlled with treatment, it may cause problems during labor and delivery, and some of those problems can be harmful to the unborn baby (fetus) and the mother. Uncontrolled gestational diabetes may also cause the newborn baby to have breathing problems and low blood glucose. Women who get gestational diabetes are more likely to develop it if they get pregnant again, and they are more likely to develop type 2 diabetes in the future. What increases the risk? This condition may be more likely to  develop in pregnant women who:  Are older than age 19 during pregnancy.  Have a family history of diabetes.  Are overweight.  Had gestational diabetes in the past.  Have polycystic ovarian syndrome (PCOS).  Are pregnant with twins or multiples.  Are of American-Indian, African-American, Hispanic/Latino, or Asian/Pacific Islander descent.  What are the signs or symptoms? Most women do not notice symptoms of gestational diabetes because the symptoms are similar to normal symptoms of pregnancy. Symptoms of gestational diabetes may include:  Increased thirst (polydipsia).  Increased hunger(polyphagia).  Increased urination (polyuria).  How is this diagnosed?  This condition may be diagnosed based on your blood glucose level, which may be checked with one or more of the following blood tests:  A fasting blood glucose (FBG) test. You will not be allowed to eat (you will fast) for at least 8 hours before a blood sample is taken.  A random blood glucose test. This checks your blood glucose at any time of day regardless of when you ate.  An oral glucose tolerance test (OGTT). This is usually done during weeks 24-28 of pregnancy. ? For this test,  you will have an FBG test done. Then, you will drink a beverage that contains glucose. Your blood glucose will be tested again 1 hour after drinking the glucose beverage (1-hour OGTT). ? If the 1-hour OGTT result is at or above 140 mg/dL (7.8 mmol/L), you will repeat the OGTT. This time, your blood glucose will be tested 3 hours after drinking the glucose beverage (3-hour OGTT).  If you have risk factors, you may be screened for undiagnosed type 2 diabetes at your first health care visit during your pregnancy (prenatal visit). How is this treated?  Your treatment may be managed by a specialist called an endocrinologist. This condition is treated by following instructions from your health care provider about:  Eating a healthier diet and getting more physical activity. These changes are the most important ways to manage gestational diabetes.  Checking your blood glucose. Do this as often as told.  Taking diabetes medicines or insulin every day. These will only be prescribed if they are needed. ? If you use insulin, you may need to adjust your dosage based on how physically active you are and what foods you eat. Your health care provider will tell you how to do this.  Your health care provider will set treatment goals for you based on the stage of your pregnancy and any other medical conditions you have. Generally, the goal of treatment is to maintain the following blood glucose levels during pregnancy:  Fasting: at or below 95 mg/dL (5.3 mmol/L).  After meals (postprandial): ? One hour after a meal: at or below 140 mg/dL (7.8 mmol/L). ? Two hours after a meal: at or below 120 mg/dL (6.7 mmol/L).  A1c (hemoglobin A1c) level: 6-6.5%.  Follow these instructions at home:  Take over-the-counter and prescription medicines only as told by your health care provider.  Manage your weight gain during pregnancy. The amount of weight that you are expected to gain depends on your pre-pregnancy BMI  (body mass index).  Keep all follow-up visits as told by your health care provider. This is important. Consider asking your health care provider these questions:   Do I need to meet with a diabetes educator?  Where can I find a support group for people with diabetes?  What equipment will I need to manage my diabetes at home?  What diabetes medicines do I need, and  when should I take them?  How often do I need to check my blood glucose?  What number can I call if I have questions?  When is my next appointment? Where to find more information:  For more information about diabetes, visit: ? American Diabetes Association (ADA): www.diabetes.org ? American Association of Diabetes Educators (AADE): www.diabeteseducator.org/patient-resources Contact a health care provider if:  Your blood glucose level is at or above 240 mg/dL (07.2 mmol/L).  Your blood glucose level is at or above 200 mg/dL (18.2 mmol/L) and you have ketones in your urine.  You have been sick or have had a fever for 2 days or more and you are not getting better.  You have any of the following problems for more than 6 hours: ? You cannot eat or drink. ? You have nausea and vomiting. ? You have diarrhea. Get help right away if:  Your blood glucose is below 54 mg/dL (3 mmol/L).  You become confused or you have trouble thinking clearly.  You have difficulty breathing.  You have moderate or large ketone levels in your urine.  Your baby is moving around less than usual.  You develop unusual discharge or bleeding from your vagina.  You start having contractions early (prematurely). Contractions may feel like a tightening in your lower abdomen. This information is not intended to replace advice given to you by your health care provider. Make sure you discuss any questions you have with your health care provider. Document Released: 03/14/2001 Document Revised: 05/13/2016 Document Reviewed: 01/09/2016 Elsevier  Interactive Patient Education  2017 [employer].

## 2017-08-12 NOTE — Progress Notes (Signed)
Subjective:  Kathryn Wilkins is a 31 y.o. G2P0010 at [redacted]w[redacted]d being seen today for ongoing prenatal care. Transferred from [facility] d/t GDM. Failed early glucola and 3 hr GTT. Has seen Nutritionist. Prior first trimester SAB. No chronic medical problems or meds otherwise.  She is currently monitored for the following issues for this high-risk pregnancy and has Late prenatal care; Gestational diabetes; and Supervision of high risk pregnancy, antepartum on her problem list.  Patient reports no complaints.  Contractions: Not present. Vag. Bleeding: None.  Movement: Present. Denies leaking of fluid.   The following portions of the patient's history were reviewed and updated as appropriate: allergies, current medications, past family history, past medical history, past social history, past surgical history and problem list. Problem list updated.  Objective:   Vitals:   08/12/17 0939 08/12/17 0943  BP: (!) 146/81 131/76  Pulse: 87 88  Weight: 202 lb 3.2 oz (91.7 kg)     Fetal Status: Fetal Heart Rate (bpm): 155   Movement: Present     General:  Alert, oriented and cooperative. Patient is in no acute distress.  Skin: Skin is warm and dry. No rash noted.   Cardiovascular: Normal heart rate noted  Respiratory: Normal respiratory effort, no problems with respiration noted  Abdomen: Soft, gravid, appropriate for gestational age. Pain/Pressure: Absent     Pelvic:  Cervical exam deferred        Extremities: Normal range of motion.  Edema: None  Mental Status: Normal mood and affect. Normal behavior. Normal judgment and thought content.   Urinalysis:      Assessment and Plan:  Pregnancy: G2P0010 at [redacted]w[redacted]d  1. Supervision of high risk pregnancy, antepartum Stable - [place] MFM OB COMP + 14 WK; Future  2. Diet controlled gestational diabetes mellitus (GDM) in second trimester GDM and pregnancy reviewed with pt. Potential for antenatal testing reviewed with pt as well. BS in goal range except for a few which  were diet choice related Continue with diet - [place] MFM OB COMP + 14 WK; Future  Preterm labor symptoms and general obstetric precautions including but not limited to vaginal bleeding, contractions, leaking of fluid and fetal movement were reviewed in detail with the patient. Please refer to After Visit Summary for other counseling recommendations.  Return in about 4 weeks (around 09/09/2017) for OB visit.   Hermina Staggers, MD

## 2017-08-26 ENCOUNTER — Other Ambulatory Visit: Payer: Self-pay | Admitting: Obstetrics and Gynecology

## 2017-08-26 ENCOUNTER — Ambulatory Visit (HOSPITAL_COMMUNITY)
Admission: RE | Admit: 2017-08-26 | Discharge: 2017-08-26 | Disposition: A | Discharge status: Home / Self Care | Payer: Medicaid Other | Source: Ambulatory Visit | Attending: Obstetrics and Gynecology | Admitting: Obstetrics and Gynecology

## 2017-08-26 ENCOUNTER — Other Ambulatory Visit (HOSPITAL_COMMUNITY): Payer: Self-pay | Admitting: *Deleted

## 2017-08-26 ENCOUNTER — Encounter (HOSPITAL_COMMUNITY): Payer: Self-pay

## 2017-08-26 DIAGNOSIS — O24410 Gestational diabetes mellitus in pregnancy, diet controlled: Secondary | ICD-10-CM

## 2017-08-26 DIAGNOSIS — O0990 Supervision of high risk pregnancy, unspecified, unspecified trimester: Secondary | ICD-10-CM

## 2017-08-26 DIAGNOSIS — Z3A20 20 weeks gestation of pregnancy: Secondary | ICD-10-CM | POA: Diagnosis not present

## 2017-08-26 DIAGNOSIS — O99212 Obesity complicating pregnancy, second trimester: Secondary | ICD-10-CM | POA: Diagnosis not present

## 2017-08-26 HISTORY — DX: Gestational diabetes mellitus in pregnancy, unspecified control: O24.419

## 2017-08-26 HISTORY — DX: Essential (primary) hypertension: I10

## 2017-09-08 ENCOUNTER — Ambulatory Visit (INDEPENDENT_AMBULATORY_CARE_PROVIDER_SITE_OTHER): Payer: Medicaid Other | Admitting: Advanced Practice Midwife

## 2017-09-08 VITALS — BP 123/68 | HR 89 | Wt 199.0 lb

## 2017-09-08 DIAGNOSIS — O24410 Gestational diabetes mellitus in pregnancy, diet controlled: Secondary | ICD-10-CM

## 2017-09-08 NOTE — Progress Notes (Signed)
   PRENATAL VISIT NOTE  Subjective:  Kathryn Wilkins is a 31 y.o. G2P0010 at [redacted]w[redacted]d being seen today for ongoing prenatal care.  She is currently monitored for the following issues for this high-risk pregnancy and has Late prenatal care; Gestational diabetes; and Supervision of high risk pregnancy, antepartum on her problem list.  Patient reports no complaints.   .  .  Movement: Present. Denies leaking of fluid.   The following portions of the patient's history were reviewed and updated as appropriate: allergies, current medications, past family history, past medical history, past social history, past surgical history and problem list. Problem list updated.  Objective:   Vitals:   09/08/17 0929  BP: 123/68  Pulse: 89  Weight: 199 lb (90.3 kg)    Fetal Status: Fetal Heart Rate (bpm): 154   Movement: Present    FH 23 cm  General:  Alert, oriented and cooperative. Patient is in no acute distress.  Skin: Skin is warm and dry. No rash noted.   Cardiovascular: Normal heart rate noted  Respiratory: Normal respiratory effort, no problems with respiration noted  Abdomen: Soft, gravid, appropriate for gestational age.  Pain/Pressure: Absent     Pelvic: Cervical exam deferred        Extremities: Normal range of motion.     Mental Status:  Normal mood and affect. Normal behavior. Normal judgment and thought content.   Assessment and Plan:  Pregnancy: G2P0010 at [redacted]w[redacted]d  1. Diet controlled gestational diabetes mellitus (GDM) in second trimester - Continue diet - CBG log reviewed 5/23 fasting CBG >90, patient eating a snack around midnight on those days. Discussed no snack at midnight. 10/66 PP CBG>120, none >130.   Term labor symptoms and general obstetric precautions including but not limited to vaginal bleeding, contractions, leaking of fluid and fetal movement were reviewed in detail with the patient. Please refer to After Visit Summary for other counseling recommendations.  Return in about 4  weeks (around 10/06/2017).   Thressa Sheller, CNM

## 2017-09-08 NOTE — Patient Instructions (Signed)

## 2017-09-08 NOTE — Progress Notes (Signed)
Decline flu injection at this time

## 2017-09-23 ENCOUNTER — Other Ambulatory Visit (HOSPITAL_COMMUNITY): Payer: Self-pay | Admitting: Maternal & Fetal Medicine

## 2017-09-23 ENCOUNTER — Ambulatory Visit (HOSPITAL_COMMUNITY)
Admission: RE | Admit: 2017-09-23 | Discharge: 2017-09-23 | Disposition: A | Discharge status: Home / Self Care | Payer: Medicaid Other | Source: Ambulatory Visit | Attending: Obstetrics and Gynecology | Admitting: Obstetrics and Gynecology

## 2017-09-23 DIAGNOSIS — Z362 Encounter for other antenatal screening follow-up: Secondary | ICD-10-CM

## 2017-09-23 DIAGNOSIS — Z3A24 24 weeks gestation of pregnancy: Secondary | ICD-10-CM | POA: Diagnosis not present

## 2017-09-23 DIAGNOSIS — O24410 Gestational diabetes mellitus in pregnancy, diet controlled: Secondary | ICD-10-CM

## 2017-09-23 DIAGNOSIS — O99212 Obesity complicating pregnancy, second trimester: Secondary | ICD-10-CM | POA: Insufficient documentation

## 2017-09-23 DIAGNOSIS — O99210 Obesity complicating pregnancy, unspecified trimester: Secondary | ICD-10-CM

## 2017-10-06 ENCOUNTER — Encounter: Payer: Medicaid Other | Admitting: Obstetrics & Gynecology

## 2017-10-14 ENCOUNTER — Ambulatory Visit (INDEPENDENT_AMBULATORY_CARE_PROVIDER_SITE_OTHER): Payer: Medicaid Other | Admitting: Family Medicine

## 2017-10-14 VITALS — BP 131/65 | HR 97 | Wt 206.0 lb

## 2017-10-14 DIAGNOSIS — O0990 Supervision of high risk pregnancy, unspecified, unspecified trimester: Secondary | ICD-10-CM

## 2017-10-14 DIAGNOSIS — O0932 Supervision of pregnancy with insufficient antenatal care, second trimester: Secondary | ICD-10-CM

## 2017-10-14 DIAGNOSIS — O0992 Supervision of high risk pregnancy, unspecified, second trimester: Secondary | ICD-10-CM

## 2017-10-14 DIAGNOSIS — O0930 Supervision of pregnancy with insufficient antenatal care, unspecified trimester: Secondary | ICD-10-CM

## 2017-10-14 DIAGNOSIS — O24410 Gestational diabetes mellitus in pregnancy, diet controlled: Secondary | ICD-10-CM

## 2017-10-14 LAB — POCT URINALYSIS DIP (DEVICE)
BILIRUBIN URINE: NEGATIVE
GLUCOSE, UA: 500 mg/dL — AB
Hgb urine dipstick: NEGATIVE
KETONES UR: 15 mg/dL — AB
Nitrite: NEGATIVE
Protein, ur: 30 mg/dL — AB
Specific Gravity, Urine: >=1.03 (ref 1.005–1.030)
Urobilinogen, UA: 0.2 mg/dL (ref 0.0–1.0)
pH: 6 (ref 5.0–8.0)

## 2017-10-14 NOTE — Patient Instructions (Addendum)
Breastfeeding Deciding to breastfeed is one of the best choices you can make for you and your baby. A change in hormones during pregnancy causes your breast tissue to grow and increases the number and size of your milk ducts. These hormones also allow proteins, sugars, and fats from your blood supply to make breast milk in your milk-producing glands. Hormones prevent breast milk from being released before your baby is born as well as prompt milk flow after birth. Once breastfeeding has begun, thoughts of your baby, as well as his or her sucking or crying, can stimulate the release of milk from your milk-producing glands. Benefits of breastfeeding For Your Baby  Your first milk (colostrum) helps your baby's digestive system function better.  There are antibodies in your milk that help your baby fight off infections.  Your baby has a lower incidence of asthma, allergies, and sudden infant death syndrome.  The nutrients in breast milk are better for your baby than infant formulas and are designed uniquely for your baby's needs.  Breast milk improves your baby's brain development.  Your baby is less likely to develop other conditions, such as childhood obesity, asthma, or type 2 diabetes mellitus.  For You  Breastfeeding helps to create a very special bond between you and your baby.  Breastfeeding is convenient. Breast milk is always available at the correct temperature and costs nothing.  Breastfeeding helps to burn calories and helps you lose the weight gained during pregnancy.  Breastfeeding makes your uterus contract to its prepregnancy size faster and slows bleeding (lochia) after you give birth.  Breastfeeding helps to lower your risk of developing type 2 diabetes mellitus, osteoporosis, and breast or ovarian cancer later in life.  Signs that your baby is hungry Early Signs of Hunger  Increased alertness or activity.  Stretching.  Movement of the head from side to  side.  Movement of the head and opening of the mouth when the corner of the mouth or cheek is stroked (rooting).  Increased sucking sounds, smacking lips, cooing, sighing, or squeaking.  Hand-to-mouth movements.  Increased sucking of fingers or hands.  Late Signs of Hunger  Fussing.  Intermittent crying.  Extreme Signs of Hunger Signs of extreme hunger will require calming and consoling before your baby will be able to breastfeed successfully. Do not wait for the following signs of extreme hunger to occur before you initiate breastfeeding:  Restlessness.  A loud, strong cry.  Screaming.  Breastfeeding basics Breastfeeding Initiation  Find a comfortable place to sit or lie down, with your neck and back well supported.  Place a pillow or rolled up blanket under your baby to bring him or her to the level of your breast (if you are seated). Nursing pillows are specially designed to help support your arms and your baby while you breastfeed.  Make sure that your baby's abdomen is facing your abdomen.  Gently massage your breast. With your fingertips, massage from your chest wall toward your nipple in a circular motion. This encourages milk flow. You may need to continue this action during the feeding if your milk flows slowly.  Support your breast with 4 fingers underneath and your thumb above your nipple. Make sure your fingers are well away from your nipple and your baby's mouth.  Stroke your baby's lips gently with your finger or nipple.  When your baby's mouth is open wide enough, quickly bring your baby to your breast, placing your entire nipple and as much of the colored  area around your nipple (areola) as possible into your baby's mouth. ? More areola should be visible above your baby's upper lip than below the lower lip. ? Your baby's tongue should be between his or her lower gum and your breast.  Ensure that your baby's mouth is correctly positioned around your nipple  (latched). Your baby's lips should create a seal on your breast and be turned out (everted).  It is common for your baby to suck about 2-3 minutes in order to start the flow of breast milk.  Latching Teaching your baby how to latch on to your breast properly is very important. An improper latch can cause nipple pain and decreased milk supply for you and poor weight gain in your baby. Also, if your baby is not latched onto your nipple properly, he or she may swallow some air during feeding. This can make your baby fussy. Burping your baby when you switch breasts during the feeding can help to get rid of the air. However, teaching your baby to latch on properly is still the best way to prevent fussiness from swallowing air while breastfeeding. Signs that your baby has successfully latched on to your nipple:  Silent tugging or silent sucking, without causing you pain.  Swallowing heard between every 3-4 sucks.  Muscle movement above and in front of his or her ears while sucking.  Signs that your baby has not successfully latched on to nipple:  Sucking sounds or smacking sounds from your baby while breastfeeding.  Nipple pain.  If you think your baby has not latched on correctly, slip your finger into the corner of your baby's mouth to break the suction and place it between your baby's gums. Attempt breastfeeding initiation again. Signs of Successful Breastfeeding Signs from your baby:  A gradual decrease in the number of sucks or complete cessation of sucking.  Falling asleep.  Relaxation of his or her body.  Retention of a small amount of milk in his or her mouth.  Letting go of your breast by himself or herself.  Signs from you:  Breasts that have increased in firmness, weight, and size 1-3 hours after feeding.  Breasts that are softer immediately after breastfeeding.  Increased milk volume, as well as a change in milk consistency and color by the fifth day of  breastfeeding.  Nipples that are not sore, cracked, or bleeding.  Signs That Your Randel Books is Getting Enough Milk  Wetting at least 1-2 diapers during the first 24 hours after birth.  Wetting at least 5-6 diapers every 24 hours for the first week after birth. The urine should be clear or pale yellow by 5 days after birth.  Wetting 6-8 diapers every 24 hours as your baby continues to grow and develop.  At least 3 stools in a 24-hour period by age 32 days. The stool should be soft and yellow.  At least 3 stools in a 24-hour period by age 869 days. The stool should be seedy and yellow.  No loss of weight greater than 10% of birth weight during the first 20 days of age.  Average weight gain of 4-7 ounces (113-198 g) per week after age 86 days.  Consistent daily weight gain by age 60 days, without weight loss after the age of 2 weeks.  After a feeding, your baby may spit up a small amount. This is common. Breastfeeding frequency and duration Frequent feeding will help you make more milk and can prevent sore nipples and breast engorgement. Breastfeed  when you feel the need to reduce the fullness of your breasts or when your baby shows signs of hunger. This is called "breastfeeding on demand." Avoid introducing a pacifier to your baby while you are working to establish breastfeeding (the first 4-6 weeks after your baby is born). After this time you may choose to use a pacifier. Research has shown that pacifier use during the first year of a baby's life decreases the risk of sudden infant death syndrome (SIDS). Allow your baby to feed on each breast as long as he or she wants. Breastfeed until your baby is finished feeding. When your baby unlatches or falls asleep while feeding from the first breast, offer the second breast. Because newborns are often sleepy in the first few weeks of life, you may need to awaken your baby to get him or her to feed. Breastfeeding times will vary from baby to baby. However,  the following rules can serve as a guide to help you ensure that your baby is properly fed:  Newborns (babies 71 weeks of age or younger) may breastfeed every 1-3 hours.  Newborns should not go longer than 3 hours during the day or 5 hours during the night without breastfeeding.  You should breastfeed your baby a minimum of 8 times in a 24-hour period until you begin to introduce solid foods to your baby at around 28 months of age.  Breast milk pumping Pumping and storing breast milk allows you to ensure that your baby is exclusively fed your breast milk, even at times when you are unable to breastfeed. This is especially important if you are going back to work while you are still breastfeeding or when you are not able to be present during feedings. Your lactation consultant can give you guidelines on how long it is safe to store breast milk. A breast pump is a machine that allows you to pump milk from your breast into a sterile bottle. The pumped breast milk can then be stored in a refrigerator or freezer. Some breast pumps are operated by hand, while others use electricity. Ask your lactation consultant which type will work best for you. Breast pumps can be purchased, but some hospitals and breastfeeding support groups lease breast pumps on a monthly basis. A lactation consultant can teach you how to hand express breast milk, if you prefer not to use a pump. Caring for your breasts while you breastfeed Nipples can become dry, cracked, and sore while breastfeeding. The following recommendations can help keep your breasts moisturized and healthy:  Avoid using soap on your nipples.  Wear a supportive bra. Although not required, special nursing bras and tank tops are designed to allow access to your breasts for breastfeeding without taking off your entire bra or top. Avoid wearing underwire-style bras or extremely tight bras.  Air dry your nipples for 3-68minutes after each feeding.  Use only cotton  bra pads to absorb leaked breast milk. Leaking of breast milk between feedings is normal.  Use lanolin on your nipples after breastfeeding. Lanolin helps to maintain your skin's normal moisture barrier. If you use pure lanolin, you do not need to wash it off before feeding your baby again. Pure lanolin is not toxic to your baby. You may also hand express a few drops of breast milk and gently massage that milk into your nipples and allow the milk to air dry.  In the first few weeks after giving birth, some women experience extremely full breasts (engorgement). Engorgement can make  your breasts feel heavy, warm, and tender to the touch. Engorgement peaks within 3-5 days after you give birth. The following recommendations can help ease engorgement:  Completely empty your breasts while breastfeeding or pumping. You may want to start by applying warm, moist heat (in the shower or with warm water-soaked hand towels) just before feeding or pumping. This increases circulation and helps the milk flow. If your baby does not completely empty your breasts while breastfeeding, pump any extra milk after he or she is finished.  Wear a snug bra (nursing or regular) or tank top for 1-2 days to signal your body to slightly decrease milk production.  Apply ice packs to your breasts, unless this is too uncomfortable for you.  Make sure that your baby is latched on and positioned properly while breastfeeding.  If engorgement persists after 48 hours of following these recommendations, contact your health care provider or a Advertising copywriterlactation consultant. Overall health care recommendations while breastfeeding  Eat healthy foods. Alternate between meals and snacks, eating 3 of each per day. Because what you eat affects your breast milk, some of the foods may make your baby more irritable than usual. Avoid eating these foods if you are sure that they are negatively affecting your baby.  Drink milk, fruit juice, and water to  satisfy your thirst (about 10 glasses a day).  Rest often, relax, and continue to take your prenatal vitamins to prevent fatigue, stress, and anemia.  Continue breast self-awareness checks.  Avoid chewing and smoking tobacco. Chemicals from cigarettes that pass into breast milk and exposure to secondhand smoke may harm your baby.  Avoid alcohol and drug use, including marijuana. Some medicines that may be harmful to your baby can pass through breast milk. It is important to ask your health care provider before taking any medicine, including all over-the-counter and prescription medicine as well as vitamin and herbal supplements. It is possible to become pregnant while breastfeeding. If birth control is desired, ask your health care provider about options that will be safe for your baby. Contact a health care provider if:  You feel like you want to stop breastfeeding or have become frustrated with breastfeeding.  You have painful breasts or nipples.  Your nipples are cracked or bleeding.  Your breasts are red, tender, or warm.  You have a swollen area on either breast.  You have a fever or chills.  You have nausea or vomiting.  You have drainage other than breast milk from your nipples.  Your breasts do not become full before feedings by the fifth day after you give birth.  You feel sad and depressed.  Your baby is too sleepy to eat well.  Your baby is having trouble sleeping.  Your baby is wetting less than 3 diapers in a 24-hour period.  Your baby has less than 3 stools in a 24-hour period.  Your baby's skin or the white part of his or her eyes becomes yellow.  Your baby is not gaining weight by 685 days of age. Get help right away if:  Your baby is overly tired (lethargic) and does not want to wake up and feed.  Your baby develops an unexplained fever. This information is not intended to replace advice given to you by your health care provider. Make sure you discuss  any questions you have with your health care provider. Document Released: 12/06/2005 Document Revised: 05/19/2016 Document Reviewed: 05/30/2013 Elsevier Interactive Patient Education  2017 Elsevier Inc.  AREA PEDIATRIC/FAMILY PRACTICE PHYSICIANS  [facility] [address] [place], [place]  [address] Phone - [phone]   Fax - [phone]  [facility] [address] [place], [place] [address] Phone - [phone]   Fax - [phone]  [facility]OS [address] [place], [place]  [address] Phone - [phone]   Fax - [phone]  [facility] [address] [place], [place]  [address] Phone - [phone]   Fax - [phone]  [facility] [address] [place], [place]  [address] Phone - [phone]   Fax - [phone]  [facility] [address] [place], [place]  [address] Phone - [phone]   Fax - [phone]  [facility] [address] [place], [place]  [address] Phone - [phone]   Fax - [phone]  [facility] [address] [place], [address] [place], [place]  [address] Phone - [phone]   Fax - [phone]  [facility] AT [facility] [address] [place], [place]  [address] Phone - [phone]   Fax - [phone] [facility] [place] [address] [place], [place]  [address] Phone - [phone]   Fax - [phone]  [facility] [address] [place], [place]  [address] Phone - [phone]   Fax - [phone]  [facility] [address], [address] [place], [place]  [address] Phone - [phone]   Fax - [phone]  [facility] [address] [place], [place]  [address] Phone - [phone]   Fax - [phone]  [facility] [address],  [address] [place], [place]  [address] Phone - [phone]  [facility] [address] [place], [place]  [address] Phone - [phone]   Fax - [phone]  [facility] [address] [place], [place]  [address] Phone - [phone]   Fax - [phone]  [facility] [address] [place], [place]  [address] Phone - [phone]   Fax - [phone]  [facility] [address] [place], [place]  [address] Phone - [phone]   Fax - [phone]   [facility] [address] [place], [place]  [address] Phone - [phone]   Fax - [phone]  [facility] [address] [place], [place]  [address] Phone - [phone]   Fax - [phone]  DAVID RUBIN [address] [place], [place]  [address] Phone - [phone]   Fax - [phone]  [facility] [address] [place], [place]  [address] Phone - [phone]   Fax - [phone]  [facility] Al[facility] Chyle [address] [place], [place]  [address] Phone - [phone]   Fax - [phone] Gerarda Fraction [address] [place], [place]  [address] Phone - [phone]   Fax - [phone]  [facility] [address] [place], [place]  [address] Phone - [phone]   Fax - [phone]  [facility] [place] [address] [place], [place]  [address] Phone - [phone]   Fax - [phone]  [facility] Wyvonne Lenz MD [address] [place] [address] Phone [phone]  Fax [phone]

## 2017-10-14 NOTE — Progress Notes (Signed)
    PRENATAL VISIT NOTE  Subjective:  Kathryn Wilkins is a 31 y.o. G2P0010 at 4567w1d being seen today for ongoing prenatal care.  She is currently monitored for the following issues for this high-risk pregnancy and has Late prenatal care; Gestational diabetes; and Supervision of high risk pregnancy, antepartum on her problem list.  Patient reports no complaints. Noted vaginal spotting 2 wks ago  Contractions: Not present. Vag. Bleeding: None.  Movement: Present. Denies leaking of fluid.   The following portions of the patient's history were reviewed and updated as appropriate: allergies, current medications, past family history, past medical history, past social history, past surgical history and problem list. Problem list updated.  Objective:   Vitals:   10/14/17 0812 10/14/17 0816  BP: (!) 152/80 131/65  Pulse: 97   Weight: 206 lb (93.4 kg)     Fetal Status: Fetal Heart Rate (bpm): 138 Fundal Height: 30 cm Movement: Present     General:  Alert, oriented and cooperative. Patient is in no acute distress.  Skin: Skin is warm and dry. No rash noted.   Cardiovascular: Normal heart rate noted  Respiratory: Normal respiratory effort, no problems with respiration noted  Abdomen: Soft, gravid, appropriate for gestational age.  Pain/Pressure: Present     Pelvic: Cervical exam deferred        Extremities: Normal range of motion.  Edema: Trace  Mental Status:  Normal mood and affect. Normal behavior. Normal judgment and thought content.  No book today FBS 70-80s reported 2 hour pp most are in range--except for few dietary indiscretions Assessment and Plan:  Pregnancy: G2P0010 at 6367w1d  1. Supervision of high risk pregnancy, antepartum   2. Late prenatal care   3. Diet controlled gestational diabetes mellitus (GDM) in third trimester Needs baseline labs Elevated BP today--re-check 131/65--notes h/o elevated BP prior to pregnancy--Pre-eclampsia symptoms reviewed--check labs - CBC -  RPR - HIV antibody (with reflex) - Hemoglobin A1c - Comprehensive metabolic panel - TSH - Protein / creatinine ratio, urine - US MFM OB FOLLOW UP; Future  Preterm labor symptoms and general obstetric precautions including but not limited to vaginal bleeding, contractions, leaking of fluid and fetal movement were reviewed in detail with the patient. Please refer to After Visit Summary for other counseling recommendations.  Return in 2 weeks (on 10/28/2017).   Reva Boresanya S Lora Chavers, MD

## 2017-10-15 LAB — CBC
Hematocrit: 33.7 % — ABNORMAL LOW (ref 34.0–46.6)
Hemoglobin: 10.4 g/dL — ABNORMAL LOW (ref 11.1–15.9)
MCH: 21.5 pg — ABNORMAL LOW (ref 26.6–33.0)
MCHC: 30.9 g/dL — AB (ref 31.5–35.7)
MCV: 70 fL — ABNORMAL LOW (ref 79–97)
PLATELETS: 262 10*3/uL (ref 150–379)
RBC: 4.83 x10E6/uL (ref 3.77–5.28)
RDW: 18.8 % — AB (ref 12.3–15.4)
WBC: 12.4 10*3/uL — AB (ref 3.4–10.8)

## 2017-10-15 LAB — COMPREHENSIVE METABOLIC PANEL
A/G RATIO: 1.2 (ref 1.2–2.2)
ALBUMIN: 3.4 g/dL — AB (ref 3.5–5.5)
ALK PHOS: 110 IU/L (ref 39–117)
ALT: 9 IU/L (ref 0–32)
AST: 14 IU/L (ref 0–40)
BUN / CREAT RATIO: 6 — AB (ref 9–23)
BUN: 4 mg/dL — ABNORMAL LOW (ref 6–20)
Bilirubin Total: <0.2 mg/dL (ref 0.0–1.2)
CALCIUM: 9 mg/dL (ref 8.7–10.2)
CO2: 18 mmol/L — ABNORMAL LOW (ref 20–29)
CREATININE: 0.63 mg/dL (ref 0.57–1.00)
Chloride: 108 mmol/L — ABNORMAL HIGH (ref 96–106)
GFR calc Af Amer: 137 mL/min/{1.73_m2} (ref >59)
GFR, EST NON AFRICAN AMERICAN: 119 mL/min/{1.73_m2} (ref >59)
GLOBULIN, TOTAL: 2.9 g/dL (ref 1.5–4.5)
Glucose: 112 mg/dL — ABNORMAL HIGH (ref 65–99)
POTASSIUM: 4.2 mmol/L (ref 3.5–5.2)
SODIUM: 140 mmol/L (ref 134–144)
Total Protein: 6.3 g/dL (ref 6.0–8.5)

## 2017-10-15 LAB — RPR: RPR Ser Ql: NONREACTIVE

## 2017-10-15 LAB — HIV ANTIBODY (ROUTINE TESTING W REFLEX): HIV SCREEN 4TH GENERATION: NONREACTIVE

## 2017-10-15 LAB — PROTEIN / CREATININE RATIO, URINE
CREATININE, UR: 169.4 mg/dL
Protein, Ur: 23.6 mg/dL
Protein/Creat Ratio: 139 mg/g creat (ref 0–200)

## 2017-10-15 LAB — HEMOGLOBIN A1C
Est. average glucose Bld gHb Est-mCnc: 114 mg/dL
HEMOGLOBIN A1C: 5.6 % (ref 4.8–5.6)

## 2017-10-15 LAB — TSH: TSH: 0.481 u[IU]/mL (ref 0.450–4.500)

## 2017-10-17 MED ORDER — ASPIRIN EC 81 MG PO TBEC: 81.0000 mg | DELAYED_RELEASE_TABLET | Freq: Every day | ORAL | 3 refills | Status: DC

## 2017-10-17 NOTE — Addendum Note (Signed)
Addended by: Reva BoresPRATT, Jamion Carter S on: 10/17/2017 10:39 AM   Modules accepted: Orders

## 2017-10-28 ENCOUNTER — Ambulatory Visit (HOSPITAL_COMMUNITY)
Admission: RE | Admit: 2017-10-28 | Discharge: 2017-10-28 | Disposition: A | Discharge status: Home / Self Care | Payer: Medicaid Other | Source: Ambulatory Visit | Attending: Family Medicine | Admitting: Family Medicine

## 2017-10-28 ENCOUNTER — Ambulatory Visit (INDEPENDENT_AMBULATORY_CARE_PROVIDER_SITE_OTHER): Payer: Medicaid Other | Admitting: General Practice

## 2017-10-28 ENCOUNTER — Other Ambulatory Visit (HOSPITAL_COMMUNITY)
Admission: RE | Admit: 2017-10-28 | Discharge: 2017-10-28 | Disposition: A | Discharge status: Home / Self Care | Payer: Medicaid Other | Source: Ambulatory Visit | Attending: Obstetrics and Gynecology | Admitting: Obstetrics and Gynecology

## 2017-10-28 ENCOUNTER — Encounter: Payer: Self-pay | Admitting: General Practice

## 2017-10-28 ENCOUNTER — Other Ambulatory Visit: Payer: Self-pay | Admitting: Family Medicine

## 2017-10-28 DIAGNOSIS — O99213 Obesity complicating pregnancy, third trimester: Secondary | ICD-10-CM

## 2017-10-28 DIAGNOSIS — N898 Other specified noninflammatory disorders of vagina: Secondary | ICD-10-CM | POA: Diagnosis not present

## 2017-10-28 DIAGNOSIS — Z362 Encounter for other antenatal screening follow-up: Secondary | ICD-10-CM

## 2017-10-28 DIAGNOSIS — O24410 Gestational diabetes mellitus in pregnancy, diet controlled: Secondary | ICD-10-CM

## 2017-10-28 DIAGNOSIS — Z113 Encounter for screening for infections with a predominantly sexual mode of transmission: Secondary | ICD-10-CM

## 2017-10-28 DIAGNOSIS — B373 Candidiasis of vulva and vagina: Secondary | ICD-10-CM | POA: Diagnosis not present

## 2017-10-28 DIAGNOSIS — Z3A29 29 weeks gestation of pregnancy: Secondary | ICD-10-CM | POA: Insufficient documentation

## 2017-10-28 NOTE — Progress Notes (Signed)
Patient reports to office today with vaginal discharge and itching. Instructed patient in self swab collection. Told patient she may start OTC Monistat 7 and the results will be back by Monday. Told patient we will review her results at her appt on Monday and can send in prescriptions if needed. Patient verbalized understanding & had no questions

## 2017-10-31 ENCOUNTER — Encounter: Payer: Self-pay | Admitting: Family Medicine

## 2017-10-31 ENCOUNTER — Encounter: Payer: Medicaid Other | Admitting: Obstetrics & Gynecology

## 2017-10-31 ENCOUNTER — Ambulatory Visit (INDEPENDENT_AMBULATORY_CARE_PROVIDER_SITE_OTHER): Payer: Medicaid Other | Admitting: Family Medicine

## 2017-10-31 VITALS — BP 123/72 | HR 94 | Wt 211.1 lb

## 2017-10-31 DIAGNOSIS — O24410 Gestational diabetes mellitus in pregnancy, diet controlled: Secondary | ICD-10-CM

## 2017-10-31 DIAGNOSIS — O0993 Supervision of high risk pregnancy, unspecified, third trimester: Secondary | ICD-10-CM

## 2017-10-31 DIAGNOSIS — O0990 Supervision of high risk pregnancy, unspecified, unspecified trimester: Secondary | ICD-10-CM

## 2017-10-31 LAB — CERVICOVAGINAL ANCILLARY ONLY
Bacterial vaginitis: NEGATIVE
Candida vaginitis: POSITIVE — AB
TRICH (WINDOWPATH): NEGATIVE

## 2017-10-31 NOTE — Progress Notes (Signed)
Declines tdap 

## 2017-10-31 NOTE — Progress Notes (Signed)
   PRENATAL VISIT NOTE  Subjective:  Kathryn Wilkins is a 31 y.o. G2P0010 at 842w4d being seen today for ongoing prenatal care.  She is currently monitored for the following issues for this high-risk pregnancy and has Late prenatal care; Gestational diabetes; and Supervision of high risk pregnancy, antepartum on their problem list.  GDM: Patient diet controlled.  Fasting: controlled 2hr PP: mostly controlled - a few slightly elevated after dinner (120-123)  Patient reports no complaints.  Contractions: Not present. Vag. Bleeding: None.  Movement: Present. Denies leaking of fluid.   The following portions of the patient's history were reviewed and updated as appropriate: allergies, current medications, past family history, past medical history, past social history, past surgical history and problem list. Problem list updated.  Objective:   Vitals:   10/31/17 1358  BP: 123/72  Pulse: 94  Weight: 211 lb 1.6 oz (95.8 kg)    Fetal Status: Fetal Heart Rate (bpm): 142   Movement: Present     General:  Alert, oriented and cooperative. Patient is in no acute distress.  Skin: Skin is warm and dry. No rash noted.   Cardiovascular: Normal heart rate noted  Respiratory: Normal respiratory effort, no problems with respiration noted  Abdomen: Soft, gravid, appropriate for gestational age.  Pain/Pressure: Absent     Pelvic: Cervical exam deferred        Extremities: Normal range of motion.  Edema: Trace  Mental Status:  Normal mood and affect. Normal behavior. Normal judgment and thought content.   Assessment and Plan:  Pregnancy: G2P0010 at 322w4d  1. Supervision of high risk pregnancy, antepartum Fht and FH normal  2. Diet controlled gestational diabetes mellitus (GDM) in third trimester controlled  Preterm labor symptoms and general obstetric precautions including but not limited to vaginal bleeding, contractions, leaking of fluid and fetal movement were reviewed in detail with the  patient. Please refer to After Visit Summary for other counseling recommendations.  Return in about 2 weeks (around 11/14/2017) for HR OB f/u.   Levie HeritageJacob J Teairra Millar, DO

## 2017-11-03 ENCOUNTER — Other Ambulatory Visit: Payer: Self-pay | Admitting: General Practice

## 2017-11-03 ENCOUNTER — Encounter: Payer: Self-pay | Admitting: General Practice

## 2017-11-03 DIAGNOSIS — B379 Candidiasis, unspecified: Secondary | ICD-10-CM

## 2017-11-03 MED ORDER — FLUCONAZOLE 150 MG PO TABS: 150.0000 mg | ORAL_TABLET | ORAL | 0 refills | Status: AC

## 2017-11-09 ENCOUNTER — Encounter: Payer: Self-pay | Admitting: General Practice

## 2017-11-14 ENCOUNTER — Encounter: Payer: Medicaid Other | Admitting: Obstetrics and Gynecology

## 2017-11-24 ENCOUNTER — Telehealth: Payer: Self-pay | Admitting: General Practice

## 2017-11-24 NOTE — Telephone Encounter (Signed)
Patient has not used baby scripts in weeks to log blood sugars. Per chart review, patient has been writing them down. Telephone call to patient to inquire if she is still using the app, no answer- left message to call me back. Will remove patient.

## 2017-11-29 ENCOUNTER — Encounter: Payer: Medicaid Other | Admitting: Advanced Practice Midwife

## 2017-12-01 ENCOUNTER — Telehealth: Payer: Self-pay | Admitting: *Deleted

## 2017-12-01 ENCOUNTER — Ambulatory Visit (INDEPENDENT_AMBULATORY_CARE_PROVIDER_SITE_OTHER): Payer: Medicaid Other | Admitting: Obstetrics and Gynecology

## 2017-12-01 ENCOUNTER — Encounter: Payer: Self-pay | Admitting: Obstetrics and Gynecology

## 2017-12-01 VITALS — BP 136/79 | HR 79 | Temp 98.3°F | Wt 215.7 lb

## 2017-12-01 DIAGNOSIS — O0990 Supervision of high risk pregnancy, unspecified, unspecified trimester: Secondary | ICD-10-CM

## 2017-12-01 DIAGNOSIS — O24410 Gestational diabetes mellitus in pregnancy, diet controlled: Secondary | ICD-10-CM

## 2017-12-01 DIAGNOSIS — O133 Gestational [pregnancy-induced] hypertension without significant proteinuria, third trimester: Secondary | ICD-10-CM

## 2017-12-01 LAB — POCT URINALYSIS DIP (DEVICE)
GLUCOSE, UA: NEGATIVE mg/dL
Hgb urine dipstick: NEGATIVE
Ketones, ur: 15 mg/dL — AB
NITRITE: NEGATIVE
PH: 6.5 (ref 5.0–8.0)
PROTEIN: 30 mg/dL — AB
Specific Gravity, Urine: 1.025 (ref 1.005–1.030)
Urobilinogen, UA: 0.2 mg/dL (ref 0.0–1.0)

## 2017-12-01 NOTE — Telephone Encounter (Signed)
Called patient with u/s appt. 12/29/17 @ 1330. Patient voiced understanding.

## 2017-12-01 NOTE — Progress Notes (Signed)
Prenatal Visit Note Date: 12/01/2017 Clinic: [facility]  Subjective:  Kathryn Wilkins is a 31 y.o. G2P0010 at [redacted]w[redacted]d being seen today for ongoing prenatal care.  She is currently monitored for the following issues for this high-risk pregnancy and has Late prenatal care; GDM, class A1; and Supervision of high risk pregnancy, antepartum on their problem list.  Patient reports no complaints.   Contractions: Irritability. Vag. Bleeding: None.  Movement: Present. Denies leaking of fluid.   The following portions of the patient's history were reviewed and updated as appropriate: allergies, current medications, past family history, past medical history, past social history, past surgical history and problem list. Problem list updated.  Objective:   Vitals:   12/01/17 1256 12/01/17 1310  BP: 140/79 136/79  Pulse: 79   Temp: 98.3 F (36.8 C)   Weight: 215 lb 11.2 oz (97.8 kg)     Fetal Status: Fetal Heart Rate (bpm): 135   Movement: Present     General:  Alert, oriented and cooperative. Patient is in no acute distress.  Skin: Skin is warm and dry. No rash noted.   Cardiovascular: Normal heart rate noted  Respiratory: Normal respiratory effort, no problems with respiration noted  Abdomen: Soft, gravid, appropriate for gestational age. Pain/Pressure: Present     Pelvic:  Cervical exam deferred        Extremities: Normal range of motion.  Edema: Trace  Mental Status: Normal mood and affect. Normal behavior. Normal judgment and thought content.   Urinalysis:      Assessment and Plan:  Pregnancy: G2P0010 at [redacted]w[redacted]d  1. Transient hypertension of pregnancy in third trimester Rpt bp normal. Pre-x precautions given  2. Supervision of high risk pregnancy, antepartum Surveillance growth 32m - US MFM OB FOLLOW UP; Future  3. GDM, class A1 Normal bs log on diet control - US MFM OB FOLLOW UP; Future  Preterm labor symptoms and general obstetric precautions including but not  limited to vaginal bleeding, contractions, leaking of fluid and fetal movement were reviewed in detail with the patient. Please refer to After Visit Summary for other counseling recommendations.  Return in about 1 week (around 12/08/2017) for bp only visit. 2wk hrob.   [place] BingPickens, Andrae Claunch, MD

## 2017-12-08 ENCOUNTER — Encounter: Payer: Self-pay | Admitting: Obstetrics and Gynecology

## 2017-12-08 ENCOUNTER — Ambulatory Visit (INDEPENDENT_AMBULATORY_CARE_PROVIDER_SITE_OTHER): Payer: Medicaid Other | Admitting: *Deleted

## 2017-12-08 ENCOUNTER — Ambulatory Visit (INDEPENDENT_AMBULATORY_CARE_PROVIDER_SITE_OTHER): Payer: Medicaid Other | Admitting: Obstetrics and Gynecology

## 2017-12-08 VITALS — BP 150/86 | HR 79

## 2017-12-08 DIAGNOSIS — O133 Gestational [pregnancy-induced] hypertension without significant proteinuria, third trimester: Secondary | ICD-10-CM

## 2017-12-08 DIAGNOSIS — Z0130 Encounter for examination of blood pressure without abnormal findings: Secondary | ICD-10-CM

## 2017-12-08 LAB — POCT URINALYSIS DIP (DEVICE)
BILIRUBIN URINE: NEGATIVE
GLUCOSE, UA: NEGATIVE mg/dL
Hgb urine dipstick: NEGATIVE
Ketones, ur: 40 mg/dL — AB
NITRITE: NEGATIVE
Protein, ur: NEGATIVE mg/dL
Specific Gravity, Urine: 1.02 (ref 1.005–1.030)
Urobilinogen, UA: 0.2 mg/dL (ref 0.0–1.0)
pH: 7 (ref 5.0–8.0)

## 2017-12-08 NOTE — Progress Notes (Signed)
Prenatal Visit Note Date: 12/08/2017 Clinic: [facility]  Subjective:  Nile Deareayra Gayheart is a 31 y.o. G2P0010 at 3566w0d being seen today for ongoing prenatal care.  She is currently monitored for the following issues for this high-risk pregnancy and has Late prenatal care; GDM, class A1; Supervision of high risk pregnancy, antepartum; and Gestational hypertension, third trimester on their problem list.  Patient reports no complaints.  No s/s of pre-x  .  .   . Denies leaking of fluid, no VB or decreased FM  The following portions of the patient's history were reviewed and updated as appropriate: allergies, current medications, past family history, past medical history, past social history, past surgical history and problem list. Problem list updated.  Objective:   Vitals:   12/08/17 1132 12/08/17 1134  BP: 140/85 (!) 150/86  Pulse: 79 79    Fetal Status:         FHR 130s FH 36  General:  Alert, oriented and cooperative. Patient is in no acute distress.  Skin: Skin is warm and dry. No rash noted.   Cardiovascular: Normal heart rate noted  Respiratory: Normal respiratory effort, no problems with respiration noted  Abdomen: Soft, gravid, appropriate for gestational age.       Pelvic:  Cervical exam deferred        Extremities: Normal range of motion.     Mental Status: Normal mood and affect. Normal behavior. Normal judgment and thought content.   Urinalysis:      Assessment and Plan:  Pregnancy: G2P0010 at 2966w0d  1. Blood pressure check  2. Gestational hypertension, third trimester No s/s. Precautions given. Will bring for nst today and then for growth and ap testing starting next week. D/w her re: 37wk iol. Pt states blood sugars have been normal.  - CBC - CMP and Liver - Protein / Creatinine Ratio, Urine  Preterm labor symptoms and general obstetric precautions including but not limited to vaginal bleeding, contractions, leaking of fluid and fetal movement  were reviewed in detail with the patient. Please refer to After Visit Summary for other counseling recommendations.  No Follow-up on file.   [place] BingPickens, Huck Ashworth, MD

## 2017-12-09 LAB — CMP AND LIVER
ALBUMIN: 3.1 g/dL — AB (ref 3.5–5.5)
ALK PHOS: 172 IU/L — AB (ref 39–117)
ALT: 9 IU/L (ref 0–32)
AST: 16 IU/L (ref 0–40)
BILIRUBIN, DIRECT: 0.07 mg/dL (ref 0.00–0.40)
BUN: 5 mg/dL — ABNORMAL LOW (ref 6–20)
CALCIUM: 9 mg/dL (ref 8.7–10.2)
CHLORIDE: 109 mmol/L — AB (ref 96–106)
CO2: 18 mmol/L — AB (ref 20–29)
Creatinine, Ser: 0.64 mg/dL (ref 0.57–1.00)
GFR calc non Af Amer: 118 mL/min/{1.73_m2} (ref >59)
GFR, EST AFRICAN AMERICAN: 137 mL/min/{1.73_m2} (ref >59)
Glucose: 75 mg/dL (ref 65–99)
POTASSIUM: 4.6 mmol/L (ref 3.5–5.2)
SODIUM: 139 mmol/L (ref 134–144)
TOTAL PROTEIN: 5.7 g/dL — AB (ref 6.0–8.5)

## 2017-12-09 LAB — CBC
HEMATOCRIT: 33.2 % — AB (ref 34.0–46.6)
HEMOGLOBIN: 9.7 g/dL — AB (ref 11.1–15.9)
MCH: 19.8 pg — ABNORMAL LOW (ref 26.6–33.0)
MCHC: 29.2 g/dL — ABNORMAL LOW (ref 31.5–35.7)
MCV: 68 fL — AB (ref 79–97)
Platelets: 223 10*3/uL (ref 150–379)
RBC: 4.9 x10E6/uL (ref 3.77–5.28)
RDW: 18.2 % — AB (ref 12.3–15.4)
WBC: 8.6 10*3/uL (ref 3.4–10.8)

## 2017-12-09 LAB — PROTEIN / CREATININE RATIO, URINE
CREATININE, UR: 141.6 mg/dL
Protein, Ur: 18.5 mg/dL
Protein/Creat Ratio: 131 mg/g creat (ref 0–200)

## 2017-12-09 NOTE — Progress Notes (Signed)
NST Note Date: 12/08/2017 Gestational Age: 32/0 FHT: 130 baseline, positive accelerations, negative deceleration, moderate variability Toco: quiet Time: 30 minutes  A/P: rNST. Continue with current care  Kathryn Wilkins, Jr MD Attending [facility](Faculty Practice)

## 2017-12-12 ENCOUNTER — Ambulatory Visit (HOSPITAL_COMMUNITY)
Admission: RE | Admit: 2017-12-12 | Discharge: 2017-12-12 | Disposition: A | Discharge status: Home / Self Care | Payer: Medicaid Other | Source: Ambulatory Visit | Attending: Obstetrics and Gynecology | Admitting: Obstetrics and Gynecology

## 2017-12-12 ENCOUNTER — Other Ambulatory Visit: Payer: Self-pay | Admitting: Obstetrics and Gynecology

## 2017-12-12 DIAGNOSIS — O24410 Gestational diabetes mellitus in pregnancy, diet controlled: Secondary | ICD-10-CM | POA: Diagnosis not present

## 2017-12-12 DIAGNOSIS — Z3A35 35 weeks gestation of pregnancy: Secondary | ICD-10-CM | POA: Diagnosis not present

## 2017-12-12 DIAGNOSIS — O133 Gestational [pregnancy-induced] hypertension without significant proteinuria, third trimester: Secondary | ICD-10-CM | POA: Diagnosis not present

## 2017-12-12 DIAGNOSIS — O99213 Obesity complicating pregnancy, third trimester: Secondary | ICD-10-CM | POA: Diagnosis not present

## 2017-12-12 DIAGNOSIS — Z6835 Body mass index (BMI) 35.0-35.9, adult: Secondary | ICD-10-CM | POA: Diagnosis not present

## 2017-12-14 ENCOUNTER — Encounter: Payer: Self-pay | Admitting: Obstetrics and Gynecology

## 2017-12-14 ENCOUNTER — Other Ambulatory Visit: Payer: Self-pay

## 2017-12-14 ENCOUNTER — Encounter (HOSPITAL_COMMUNITY): Payer: Self-pay

## 2017-12-14 ENCOUNTER — Observation Stay (HOSPITAL_COMMUNITY)
Admission: AD | Admit: 2017-12-14 | Discharge: 2017-12-15 | Disposition: A | Discharge status: Home / Self Care | Payer: Medicaid Other | Source: Ambulatory Visit | Attending: Obstetrics & Gynecology | Admitting: Obstetrics & Gynecology

## 2017-12-14 ENCOUNTER — Ambulatory Visit (HOSPITAL_COMMUNITY): Payer: Medicaid Other | Admitting: General Practice

## 2017-12-14 ENCOUNTER — Ambulatory Visit (INDEPENDENT_AMBULATORY_CARE_PROVIDER_SITE_OTHER): Payer: Medicaid Other | Admitting: Obstetrics & Gynecology

## 2017-12-14 VITALS — BP 160/98 | HR 70 | Wt 220.4 lb

## 2017-12-14 DIAGNOSIS — R8290 Unspecified abnormal findings in urine: Secondary | ICD-10-CM

## 2017-12-14 DIAGNOSIS — Z79899 Other long term (current) drug therapy: Secondary | ICD-10-CM | POA: Insufficient documentation

## 2017-12-14 DIAGNOSIS — O133 Gestational [pregnancy-induced] hypertension without significant proteinuria, third trimester: Principal | ICD-10-CM | POA: Insufficient documentation

## 2017-12-14 DIAGNOSIS — Z7982 Long term (current) use of aspirin: Secondary | ICD-10-CM | POA: Insufficient documentation

## 2017-12-14 DIAGNOSIS — Z3A35 35 weeks gestation of pregnancy: Secondary | ICD-10-CM

## 2017-12-14 DIAGNOSIS — Z87891 Personal history of nicotine dependence: Secondary | ICD-10-CM | POA: Insufficient documentation

## 2017-12-14 DIAGNOSIS — O24410 Gestational diabetes mellitus in pregnancy, diet controlled: Secondary | ICD-10-CM | POA: Insufficient documentation

## 2017-12-14 DIAGNOSIS — O1413 Severe pre-eclampsia, third trimester: Secondary | ICD-10-CM | POA: Diagnosis present

## 2017-12-14 DIAGNOSIS — O99019 Anemia complicating pregnancy, unspecified trimester: Secondary | ICD-10-CM | POA: Insufficient documentation

## 2017-12-14 DIAGNOSIS — O0990 Supervision of high risk pregnancy, unspecified, unspecified trimester: Secondary | ICD-10-CM

## 2017-12-14 DIAGNOSIS — O0993 Supervision of high risk pregnancy, unspecified, third trimester: Secondary | ICD-10-CM

## 2017-12-14 LAB — POCT URINALYSIS DIP (DEVICE)
Bilirubin Urine: NEGATIVE
Glucose, UA: NEGATIVE mg/dL
Hgb urine dipstick: NEGATIVE
KETONES UR: NEGATIVE mg/dL
Nitrite: NEGATIVE
PH: 6.5 (ref 5.0–8.0)
PROTEIN: NEGATIVE mg/dL
Specific Gravity, Urine: 1.02 (ref 1.005–1.030)
Urobilinogen, UA: 0.2 mg/dL (ref 0.0–1.0)

## 2017-12-14 LAB — CBC
HEMATOCRIT: 31.7 % — AB (ref 36.0–46.0)
Hemoglobin: 9.9 g/dL — ABNORMAL LOW (ref 12.0–15.0)
MCH: 20.1 pg — ABNORMAL LOW (ref 26.0–34.0)
MCHC: 31.2 g/dL (ref 30.0–36.0)
MCV: 64.3 fL — ABNORMAL LOW (ref 78.0–100.0)
Platelets: 206 10*3/uL (ref 150–400)
RBC: 4.93 MIL/uL (ref 3.87–5.11)
RDW: 17.2 % — AB (ref 11.5–15.5)
WBC: 9.5 10*3/uL (ref 4.0–10.5)

## 2017-12-14 LAB — PROTEIN / CREATININE RATIO, URINE
CREATININE, URINE: 59 mg/dL
PROTEIN CREATININE RATIO: 0.17 mg/mg{creat} — AB (ref 0.00–0.15)
TOTAL PROTEIN, URINE: 10 mg/dL

## 2017-12-14 LAB — OB RESULTS CONSOLE GBS: STREP GROUP B AG: POSITIVE

## 2017-12-14 LAB — COMPREHENSIVE METABOLIC PANEL
ALBUMIN: 2.8 g/dL — AB (ref 3.5–5.0)
ALT: 13 U/L — ABNORMAL LOW (ref 14–54)
ANION GAP: 9 (ref 5–15)
AST: 22 U/L (ref 15–41)
Alkaline Phosphatase: 192 U/L — ABNORMAL HIGH (ref 38–126)
BILIRUBIN TOTAL: 0.3 mg/dL (ref 0.3–1.2)
BUN: 6 mg/dL (ref 6–20)
CO2: 17 mmol/L — ABNORMAL LOW (ref 22–32)
Calcium: 8.8 mg/dL — ABNORMAL LOW (ref 8.9–10.3)
Chloride: 110 mmol/L (ref 101–111)
Creatinine, Ser: 0.59 mg/dL (ref 0.44–1.00)
GFR calc Af Amer: >60 mL/min (ref >60)
GLUCOSE: 85 mg/dL (ref 65–99)
POTASSIUM: 4 mmol/L (ref 3.5–5.1)
Sodium: 136 mmol/L (ref 135–145)
TOTAL PROTEIN: 6.5 g/dL (ref 6.5–8.1)

## 2017-12-14 LAB — WET PREP, GENITAL
Clue Cells Wet Prep HPF POC: NONE SEEN
Sperm: NONE SEEN
Trich, Wet Prep: NONE SEEN

## 2017-12-14 LAB — TYPE AND SCREEN
ABO/RH(D): O POS
Antibody Screen: NEGATIVE

## 2017-12-14 LAB — ABO/RH: ABO/RH(D): O POS

## 2017-12-14 LAB — GLUCOSE, CAPILLARY: Glucose-Capillary: 110 mg/dL — ABNORMAL HIGH (ref 65–99)

## 2017-12-14 MED ORDER — PRENATAL MULTIVITAMIN CH: 1.0000 | ORAL_TABLET | Freq: Every day | ORAL | Status: DC

## 2017-12-14 MED ORDER — LABETALOL HCL 5 MG/ML IV SOLN
20.0000 mg | INTRAVENOUS | Status: AC | PRN
Start: 2017-12-14 — End: 2017-12-14
  Administered 2017-12-14: 20 mg via INTRAVENOUS
  Administered 2017-12-14: 40 mg via INTRAVENOUS
  Administered 2017-12-14: 80 mg via INTRAVENOUS
  Filled 2017-12-14: qty 16
  Filled 2017-12-14: qty 4
  Filled 2017-12-14: qty 8

## 2017-12-14 MED ORDER — BETAMETHASONE SOD PHOS & ACET 6 (3-3) MG/ML IJ SUSP
12.0000 mg | INTRAMUSCULAR | Status: AC
  Administered 2017-12-14 – 2017-12-15 (×2): 12 mg via INTRAMUSCULAR
  Filled 2017-12-14 (×2): qty 2

## 2017-12-14 MED ORDER — ZOLPIDEM TARTRATE 5 MG PO TABS: 5.0000 mg | ORAL_TABLET | Freq: Every evening | ORAL | Status: DC | PRN

## 2017-12-14 MED ORDER — ACETAMINOPHEN 325 MG PO TABS: 650.0000 mg | ORAL_TABLET | ORAL | Status: DC | PRN

## 2017-12-14 MED ORDER — CALCIUM CARBONATE ANTACID 500 MG PO CHEW: 2.0000 | CHEWABLE_TABLET | ORAL | Status: DC | PRN

## 2017-12-14 MED ORDER — DOCUSATE SODIUM 100 MG PO CAPS
100.0000 mg | ORAL_CAPSULE | Freq: Every day | ORAL | Status: DC
  Filled 2017-12-14: qty 1

## 2017-12-14 MED ORDER — LACTATED RINGERS IV SOLN
INTRAVENOUS | Status: DC
  Administered 2017-12-14 – 2017-12-15 (×3): via INTRAVENOUS

## 2017-12-14 MED ORDER — HYDRALAZINE HCL 20 MG/ML IJ SOLN: 10.0000 mg | Freq: Once | INTRAMUSCULAR | Status: DC | PRN

## 2017-12-14 NOTE — H&P (Signed)
Chief Complaint: No chief complaint on file.   First Provider Initiated Contact with Patient 12/14/17 [phone]  HPI  HPI: Kathryn Wilkins is a 31 y.o. G2P0010 at 70w6dho presents to maternity admissions from the office for BP evaluation. Recently diagnosed with gestational hypertension. Was in office this morning for ROB & had severe range BPs. Denies headache, visual disturbance, or epigastric pain. Denies history of hypertension outsied of pregnancy. Positive fetal movement.  Past Medical History:      Past Medical History:  Diagnosis Date  . Gestational diabetes   . Hypertension    Past obstetric history:                  OB History  Gravida Para Term Preterm AB Living  2    1 0  SAB TAB Ectopic Multiple Live Births  1        # Outcome Date GA Lbr Len/2nd Weight Sex Delivery Anes PTL Lv  2 Current           1 SAB              Past Surgical History:       Past Surgical History:  Procedure Laterality Date  . NO PAST SURGERIES     Family History:  No family history on file.  Social History:  Social History        Tobacco Use  . Smoking status: Former SResearch scientist (life sciences) . Smokeless tobacco: Never Used  Substance Use Topics  . Alcohol use: No  . Drug use: Yes    Types: Marijuana    Comment: In the past since 06/2017   Allergies: No Known Allergies  Meds:         Medications Prior to Admission  Medication Sig Dispense Refill Last Dose  . aspirin EC 81 MG tablet Take 1 tablet (81 mg total) by mouth daily. 90 tablet 3 Taking  . Prenatal Vit-Fe Fumarate-FA (PRENATAL COMPLETE) 14-0.4 MG TABS Take 0.4 mg by mouth daily. 636each 0 Taking   I have reviewed patient's Past Medical Hx, Surgical Hx, Family Hx, Social Hx, medications and allergies.  ROS:  Review of Systems  Constitutional: Negative.  Eyes: Negative for visual disturbance.  Gastrointestinal: Negative.  Genitourinary: Negative.  Neurological: Negative for headaches.   Other systems negative  Physical Exam  Patient  Vitals for the past 24 hrs:   BP Temp Pulse Resp  12/14/17 1132 (!) 148/86 - 73 -  12/14/17 1116 138/77 - 82 -  12/14/17 1101 135/76 - 81 -  12/14/17 1046 (!) 148/88 - 78 -  12/14/17 1031 (!) 141/88 - 84 -  12/14/17 1015 139/86 - 86 -  12/14/17 1000 (!) 162/89 - 81 -  12/14/17 0941 (!) 163/103 - 79 -  12/14/17 0933 (!) 156/90 - 86 -  12/14/17 0930 - 98 F (36.7 C) - -  12/14/17 0916 (!) 151/83 - 78 -  12/14/17 0905 (!) 169/102 - 78 16   Constitutional: Well-developed, well-nourished female in no acute distress.  Cardiovascular: normal rate and rhythm  Respiratory: normal effort, clear to auscultation bilaterally  GI: Abd soft, non-tender, gravid appropriate for gestational age. No rebound or guarding.  MS: Extremities nontender, BLE non pitting edema, normal ROM  Neurologic: Alert and oriented x 4. Bilateral patellar reflexes 2+, no clonus  FHT: Baseline 125 , moderate variability, accelerations present, no decelerations  Contractions: none  Labs:   Recent Results (from the past 2160 hour(s))  POCT urinalysis dip (device)  Status: Abnormal   Collection Time: 10/14/17  8:19 AM  Result Value Ref Range   Glucose, UA 500 (A) NEGATIVE mg/dL   Bilirubin Urine NEGATIVE NEGATIVE   Ketones, ur 15 (A) NEGATIVE mg/dL   Specific Gravity, Urine >=1.030 1.005 - 1.030   Hgb urine dipstick NEGATIVE NEGATIVE   pH 6.0 5.0 - 8.0   Protein, ur 30 (A) NEGATIVE mg/dL   Urobilinogen, UA 0.2 0.0 - 1.0 mg/dL   Nitrite NEGATIVE NEGATIVE   Leukocytes, UA TRACE (A) NEGATIVE    Comment: Biochemical Testing Only. Please order routine urinalysis from main lab if confirmatory testing is needed.  CBC     Status: Abnormal   Collection Time: 10/14/17  9:19 AM  Result Value Ref Range   WBC 12.4 (H) 3.4 - 10.8 x10E3/uL   RBC 4.83 3.77 - 5.28 x10E6/uL   Hemoglobin 10.4 (L) 11.1 - 15.9 g/dL   Hematocrit 33.7 (L) 34.0 - 46.6 %   MCV 70 (L) 79 - 97 fL   MCH 21.5 (L) 26.6 - 33.0 pg   MCHC 30.9 (L) 31.5 -  35.7 g/dL   RDW 18.8 (H) 12.3 - 15.4 %   Platelets 262 150 - 379 x10E3/uL  RPR     Status: None   Collection Time: 10/14/17  9:19 AM  Result Value Ref Range   RPR Ser Ql Non Reactive Non Reactive  HIV antibody (with reflex)     Status: None   Collection Time: 10/14/17  9:19 AM  Result Value Ref Range   HIV Screen 4th Generation wRfx Non Reactive Non Reactive  Hemoglobin A1c     Status: None   Collection Time: 10/14/17  9:19 AM  Result Value Ref Range   Hgb A1c MFr Bld 5.6 4.8 - 5.6 %    Comment:          Prediabetes: 5.7 - 6.4          Diabetes: >6.4          Glycemic control for adults with diabetes: <7.0    Est. average glucose Bld gHb Est-mCnc 114 mg/dL  Comprehensive metabolic panel     Status: Abnormal   Collection Time: 10/14/17  9:19 AM  Result Value Ref Range   Glucose 112 (H) 65 - 99 mg/dL   BUN 4 (L) 6 - 20 mg/dL   Creatinine, Ser 0.63 0.57 - 1.00 mg/dL   GFR calc non Af Amer 119 >59 mL/min/1.73   GFR calc Af Amer 137 >59 mL/min/1.73   BUN/Creatinine Ratio 6 (L) 9 - 23   Sodium 140 134 - 144 mmol/L   Potassium 4.2 3.5 - 5.2 mmol/L   Chloride 108 (H) 96 - 106 mmol/L   CO2 18 (L) 20 - 29 mmol/L   Calcium 9.0 8.7 - 10.2 mg/dL   Total Protein 6.3 6.0 - 8.5 g/dL   Albumin 3.4 (L) 3.5 - 5.5 g/dL   Globulin, Total 2.9 1.5 - 4.5 g/dL   Albumin/Globulin Ratio 1.2 1.2 - 2.2   Bilirubin Total <0.2 0.0 - 1.2 mg/dL   Alkaline Phosphatase 110 39 - 117 IU/L   AST 14 0 - 40 IU/L   ALT 9 0 - 32 IU/L  TSH     Status: None   Collection Time: 10/14/17  9:19 AM  Result Value Ref Range   TSH 0.481 0.450 - 4.500 uIU/mL  Protein / creatinine ratio, urine     Status: None   Collection Time: 10/14/17  9:49 AM  Result Value Ref Range   Creatinine, Urine 169.4 Not Estab. mg/dL   Protein, Ur 23.6 Not Estab. mg/dL   Protein/Creat Ratio 139 0 - 200 mg/g creat  Cervicovaginal ancillary only     Status: Abnormal   Collection Time: 10/28/17 12:00 AM  Result Value Ref Range   Bacterial  vaginitis Negative for Bacterial Vaginitis Microorganisms     Comment: Normal Reference Range - Negative   Candida vaginitis **POSITIVE for Candida species** (A)     Comment: Normal Reference Range - Negative   Trichomonas Negative     Comment: Normal Reference Range - Negative  POCT urinalysis dip (device)     Status: Abnormal   Collection Time: 12/01/17  1:39 PM  Result Value Ref Range   Glucose, UA NEGATIVE NEGATIVE mg/dL   Bilirubin Urine SMALL (A) NEGATIVE   Ketones, ur 15 (A) NEGATIVE mg/dL   Specific Gravity, Urine 1.025 1.005 - 1.030   Hgb urine dipstick NEGATIVE NEGATIVE   pH 6.5 5.0 - 8.0   Protein, ur 30 (A) NEGATIVE mg/dL   Urobilinogen, UA 0.2 0.0 - 1.0 mg/dL   Nitrite NEGATIVE NEGATIVE   Leukocytes, UA LARGE (A) NEGATIVE    Comment: Biochemical Testing Only. Please order routine urinalysis from main lab if confirmatory testing is needed.  POCT urinalysis dip (device)     Status: Abnormal   Collection Time: 12/08/17 12:00 PM  Result Value Ref Range   Glucose, UA NEGATIVE NEGATIVE mg/dL   Bilirubin Urine NEGATIVE NEGATIVE   Ketones, ur 40 (A) NEGATIVE mg/dL   Specific Gravity, Urine 1.020 1.005 - 1.030   Hgb urine dipstick NEGATIVE NEGATIVE   pH 7.0 5.0 - 8.0   Protein, ur NEGATIVE NEGATIVE mg/dL   Urobilinogen, UA 0.2 0.0 - 1.0 mg/dL   Nitrite NEGATIVE NEGATIVE   Leukocytes, UA LARGE (A) NEGATIVE    Comment: Biochemical Testing Only. Please order routine urinalysis from main lab if confirmatory testing is needed.  CBC     Status: Abnormal   Collection Time: 12/08/17 12:20 PM  Result Value Ref Range   WBC 8.6 3.4 - 10.8 x10E3/uL   RBC 4.90 3.77 - 5.28 x10E6/uL   Hemoglobin 9.7 (L) 11.1 - 15.9 g/dL   Hematocrit 33.2 (L) 34.0 - 46.6 %   MCV 68 (L) 79 - 97 fL   MCH 19.8 (L) 26.6 - 33.0 pg   MCHC 29.2 (L) 31.5 - 35.7 g/dL   RDW 18.2 (H) 12.3 - 15.4 %   Platelets 223 150 - 379 x10E3/uL  CMP and Liver     Status: Abnormal   Collection Time: 12/08/17 12:20 PM   Result Value Ref Range   Glucose 75 65 - 99 mg/dL   BUN 5 (L) 6 - 20 mg/dL   Creatinine, Ser 0.64 0.57 - 1.00 mg/dL   GFR calc non Af Amer 118 >59 mL/min/1.73   GFR calc Af Amer 137 >59 mL/min/1.73   Sodium 139 134 - 144 mmol/L   Potassium 4.6 3.5 - 5.2 mmol/L   Chloride 109 (H) 96 - 106 mmol/L   CO2 18 (L) 20 - 29 mmol/L   Calcium 9.0 8.7 - 10.2 mg/dL   Total Protein 5.7 (L) 6.0 - 8.5 g/dL   Albumin 3.1 (L) 3.5 - 5.5 g/dL   Bilirubin Total <0.2 0.0 - 1.2 mg/dL   Bilirubin, Direct 0.07 0.00 - 0.40 mg/dL   Alkaline Phosphatase 172 (H) 39 - 117 IU/L   AST 16 0 - 40 IU/L  ALT 9 0 - 32 IU/L  Protein / Creatinine Ratio, Urine     Status: None   Collection Time: 12/08/17 12:20 PM  Result Value Ref Range   Creatinine, Urine 141.6 Not Estab. mg/dL   Protein, Ur 18.5 Not Estab. mg/dL   Protein/Creat Ratio 131 0 - 200 mg/g creat  POCT urinalysis dip (device)     Status: Abnormal   Collection Time: 12/14/17  8:05 AM  Result Value Ref Range   Glucose, UA NEGATIVE NEGATIVE mg/dL   Bilirubin Urine NEGATIVE NEGATIVE   Ketones, ur NEGATIVE NEGATIVE mg/dL   Specific Gravity, Urine 1.020 1.005 - 1.030   Hgb urine dipstick NEGATIVE NEGATIVE   pH 6.5 5.0 - 8.0   Protein, ur NEGATIVE NEGATIVE mg/dL   Urobilinogen, UA 0.2 0.0 - 1.0 mg/dL   Nitrite NEGATIVE NEGATIVE   Leukocytes, UA LARGE (A) NEGATIVE    Comment: Biochemical Testing Only. Please order routine urinalysis from main lab if confirmatory testing is needed.  Protein / creatinine ratio, urine     Status: Abnormal   Collection Time: 12/14/17  8:47 AM  Result Value Ref Range   Creatinine, Urine 59.00 mg/dL   Total Protein, Urine 10 mg/dL    Comment: NO NORMAL RANGE ESTABLISHED FOR THIS TEST   Protein Creatinine Ratio 0.17 (H) 0.00 - 0.15 mg/mg[Cre]  CBC     Status: Abnormal   Collection Time: 12/14/17  8:52 AM  Result Value Ref Range   WBC 9.5 4.0 - 10.5 K/uL   RBC 4.93 3.87 - 5.11 MIL/uL   Hemoglobin 9.9 (L) 12.0 - 15.0 g/dL    HCT 31.7 (L) 36.0 - 46.0 %   MCV 64.3 (L) 78.0 - 100.0 fL   MCH 20.1 (L) 26.0 - 34.0 pg   MCHC 31.2 30.0 - 36.0 g/dL   RDW 17.2 (H) 11.5 - 15.5 %   Platelets 206 150 - 400 K/uL  Comprehensive metabolic panel     Status: Abnormal   Collection Time: 12/14/17  8:52 AM  Result Value Ref Range   Sodium 136 135 - 145 mmol/L   Potassium 4.0 3.5 - 5.1 mmol/L   Chloride 110 101 - 111 mmol/L   CO2 17 (L) 22 - 32 mmol/L   Glucose, Bld 85 65 - 99 mg/dL   BUN 6 6 - 20 mg/dL   Creatinine, Ser 0.59 0.44 - 1.00 mg/dL   Calcium 8.8 (L) 8.9 - 10.3 mg/dL   Total Protein 6.5 6.5 - 8.1 g/dL   Albumin 2.8 (L) 3.5 - 5.0 g/dL   AST 22 15 - 41 U/L   ALT 13 (L) 14 - 54 U/L   Alkaline Phosphatase 192 (H) 38 - 126 U/L   Total Bilirubin 0.3 0.3 - 1.2 mg/dL   GFR calc non Af Amer >60 >60 mL/min   GFR calc Af Amer >60 >60 mL/min    Comment: (NOTE) The eGFR has been calculated using the CKD EPI equation. This calculation has not been validated in all clinical situations. eGFR's persistently <60 mL/min signify possible Chronic Kidney Disease.    Anion gap 9 5 - 15  Wet prep, genital     Status: Abnormal   Collection Time: 12/14/17  9:02 AM  Result Value Ref Range   Yeast Wet Prep HPF POC PRESENT (A) NONE SEEN   Trich, Wet Prep NONE SEEN NONE SEEN   Clue Cells Wet Prep HPF POC NONE SEEN NONE SEEN   WBC, Wet Prep HPF POC MANY (A)  NONE SEEN    Comment: MODERATE BACTERIA SEEN   Sperm NONE SEEN      O/Positive/-- (07/26 0000)  Imaging:  MAU Course/MDM:  Severe range BPs, treated with 3 doses of IV labetalol (20, 40, 80)  Labs wnl & pt asymptomatic.  GHTN vs CHTN d/t elevated BP at 18 wks  C/w Dr. Nehemiah Settle who spoke with NICU -- will admit  Discussed patient with Dr. Si Raider to determine which unit -- pt ok to go to antenatal   Assessment:  1. Gestational hypertension, third trimester   2. GDM, class A1   3. [redacted] weeks gestation of pregnancy    Plan:  Obs bed on antenatal to follow BPs  BMZ in case  need for delivery <37 wks    Jorje Guild, FNP  12/14/2017  9:06 AM  Attestation of Attending Supervision of Provider:  Evaluation and management procedures were performed by this provider under my supervision and collaboration. I have reviewed the provider's note and chart, and I agree with the management and plan. GHTN with severe-range pressures now resolved with labetalol. Asymptomatic, labs without signs of severe disease. NST reactive. Will admit to antenatal, monitor BP and symptoms, induce if s/s severe disease.  Laurey Arrow, MD Faculty Practice, [facility]

## 2017-12-14 NOTE — Progress Notes (Signed)
Dr. Despina HiddenEure on unit. Reviewed over reactive strip of patient baby. Said baby looked good and strip was reassuring. Baby was taken off monitor.

## 2017-12-14 NOTE — Patient Instructions (Signed)
Return to clinic for any scheduled appointments or obstetric concerns, or go to MAU for evaluation  

## 2017-12-14 NOTE — MAU Note (Signed)
Pt sent from clinic for HTN

## 2017-12-14 NOTE — Progress Notes (Signed)
   PRENATAL VISIT NOTE  Subjective:  Kathryn Wilkins is a 31 y.o. G2P0010 at 8943w6d being seen today for ongoing prenatal care.  She is currently monitored for the following issues for this high-risk pregnancy and has Late prenatal care; GDM, class A1; Supervision of high risk pregnancy, antepartum; and Gestational hypertension, third trimester on their problem list.  Patient reports no complaints. No headaches, visual symptoms, RUQ pain.  Contractions: Not present. Vag. Bleeding: None.  Movement: Present. Denies leaking of fluid.   The following portions of the patient's history were reviewed and updated as appropriate: allergies, current medications, past family history, past medical history, past social history, past surgical history and problem list. Problem list updated.  Objective:   Vitals:   12/14/17 0802 12/14/17 0812  BP: (!) 164/98 (!) 160/98  Pulse: 70   Weight: 220 lb 6.4 oz (100 kg)     Fetal Status: Fetal Heart Rate (bpm): NST   Movement: Present     General:  Alert, oriented and cooperative. Patient is in no acute distress.  Skin: Skin is warm and dry. No rash noted.   Cardiovascular: Normal heart rate noted  Respiratory: Normal respiratory effort, no problems with respiration noted  Abdomen: Soft, gravid, appropriate for gestational age.  Pain/Pressure: Absent     Pelvic: Cervical exam deferred        Extremities: Normal range of motion.  Edema: Deep pitting, indentation remains for a short time  Mental Status:  Normal mood and affect. Normal behavior. Normal judgment and thought content.   Assessment and Plan:  Pregnancy: G2P0010 at 4743w6d  1. Gestational hypertension, third trimester Sent to MAU for further evaluation, concerned about severe GHTN/preeclampsia.  Discussed possible need for moving towards delivery, need for antenatal steroids etc. Team on call notified. NST performed today was reviewed and was found to be reactive.    2. GDM, class A1 Blood sugars  are within range.  3. Abnormal urinalysis Large LE and increased frequency. - Culture, OB Urine  4. Supervision of high risk pregnancy, antepartum  Preterm labor symptoms and general obstetric precautions including but not limited to vaginal bleeding, contractions, leaking of fluid and fetal movement were reviewed in detail with the patient. Please refer to After Visit Summary for other counseling recommendations.  Return in about 1 week (around 12/21/2017) for OB Visit (HOB) and/or BP check.   Jaynie CollinsUgonna Shavaughn Seidl, MD

## 2017-12-14 NOTE — MAU Provider Note (Signed)
Chief Complaint:  No chief complaint on file.   First Provider Initiated Contact with Patient 12/14/17 [phone]     HPI  HPI: Kathryn Wilkins is a 31 y.o. G2P0010 at 6935w6dwho presents to maternity admissions from the office for BP evaluation. Recently diagnosed with gestational hypertension. Was in office this morning for ROB & had severe range BPs. Denies headache, visual disturbance, or epigastric pain. Denies history of hypertension outsied of pregnancy. Positive fetal movement.   Past Medical History: Past Medical History:  Diagnosis Date  . Gestational diabetes   . Hypertension     Past obstetric history: OB History  Gravida Para Term Preterm AB Living  2       1 0  SAB TAB Ectopic Multiple Live Births  1            # Outcome Date GA Lbr Len/2nd Weight Sex Delivery Anes PTL Lv  2 Current           1 SAB               Past Surgical History: Past Surgical History:  Procedure Laterality Date  . NO PAST SURGERIES      Family History: No family history on file.  Social History: Social History   Tobacco Use  . Smoking status: Former Games developermoker  . Smokeless tobacco: Never Used  Substance Use Topics  . Alcohol use: No  . Drug use: Yes    Types: Marijuana    Comment: In the past since 06/2017    Allergies: No Known Allergies  Meds:  Medications Prior to Admission  Medication Sig Dispense Refill Last Dose  . aspirin EC 81 MG tablet Take 1 tablet (81 mg total) by mouth daily. 90 tablet 3 Taking  . Prenatal Vit-Fe Fumarate-FA (PRENATAL COMPLETE) 14-0.4 MG TABS Take 0.4 mg by mouth daily. 60 each 0 Taking    I have reviewed patient's Past Medical Hx, Surgical Hx, Family Hx, Social Hx, medications and allergies.   ROS:  Review of Systems  Constitutional: Negative.   Eyes: Negative for visual disturbance.  Gastrointestinal: Negative.   Genitourinary: Negative.   Neurological: Negative for headaches.   Other systems negative  Physical Exam   Patient Vitals for the  past 24 hrs:  BP Temp Pulse Resp  12/14/17 1132 (!) 148/86 - 73 -  12/14/17 1116 138/77 - 82 -  12/14/17 1101 135/76 - 81 -  12/14/17 1046 (!) 148/88 - 78 -  12/14/17 1031 (!) 141/88 - 84 -  12/14/17 1015 139/86 - 86 -  12/14/17 1000 (!) 162/89 - 81 -  12/14/17 0941 (!) 163/103 - 79 -  12/14/17 0933 (!) 156/90 - 86 -  12/14/17 0930 - 98 F (36.7 C) - -  12/14/17 0916 (!) 151/83 - 78 -  12/14/17 0905 (!) 169/102 - 78 16   Constitutional: Well-developed, well-nourished female in no acute distress.  Cardiovascular: normal rate and rhythm Respiratory: normal effort, clear to auscultation bilaterally GI: Abd soft, non-tender, gravid appropriate for gestational age.   No rebound or guarding. MS: Extremities nontender, BLE non pitting edema, normal ROM Neurologic: Alert and oriented x 4. Bilateral patellar reflexes 2+, no clonus   FHT:  Baseline 125 , moderate variability, accelerations present, no decelerations Contractions: none   Labs: Results for orders placed or performed during the hospital encounter of 12/14/17 (from the past 24 hour(s))  Protein / creatinine ratio, urine     Status: Abnormal   Collection Time: 12/14/17  8:47 AM  Result Value Ref Range   Creatinine, Urine 59.00 mg/dL   Total Protein, Urine 10 mg/dL   Protein Creatinine Ratio 0.17 (H) 0.00 - 0.15 mg/mg[Cre]  CBC     Status: Abnormal   Collection Time: 12/14/17  8:52 AM  Result Value Ref Range   WBC 9.5 4.0 - 10.5 K/uL   RBC 4.93 3.87 - 5.11 MIL/uL   Hemoglobin 9.9 (L) 12.0 - 15.0 g/dL   HCT 86.531.7 (L) 78.436.0 - 69.646.0 %   MCV 64.3 (L) 78.0 - 100.0 fL   MCH 20.1 (L) 26.0 - 34.0 pg   MCHC 31.2 30.0 - 36.0 g/dL   RDW 29.517.2 (H) 28.411.5 - 13.215.5 %   Platelets 206 150 - 400 K/uL  Comprehensive metabolic panel     Status: Abnormal   Collection Time: 12/14/17  8:52 AM  Result Value Ref Range   Sodium 136 135 - 145 mmol/L   Potassium 4.0 3.5 - 5.1 mmol/L   Chloride 110 101 - 111 mmol/L   CO2 17 (L) 22 - 32 mmol/L    Glucose, Bld 85 65 - 99 mg/dL   BUN 6 6 - 20 mg/dL   Creatinine, Ser 4.400.59 0.44 - 1.00 mg/dL   Calcium 8.8 (L) 8.9 - 10.3 mg/dL   Total Protein 6.5 6.5 - 8.1 g/dL   Albumin 2.8 (L) 3.5 - 5.0 g/dL   AST 22 15 - 41 U/L   ALT 13 (L) 14 - 54 U/L   Alkaline Phosphatase 192 (H) 38 - 126 U/L   Total Bilirubin 0.3 0.3 - 1.2 mg/dL   GFR calc non Af Amer >60 >60 mL/min   GFR calc Af Amer >60 >60 mL/min   Anion gap 9 5 - 15  Wet prep, genital     Status: Abnormal   Collection Time: 12/14/17  9:02 AM  Result Value Ref Range   Yeast Wet Prep HPF POC PRESENT (A) NONE SEEN   Trich, Wet Prep NONE SEEN NONE SEEN   Clue Cells Wet Prep HPF POC NONE SEEN NONE SEEN   WBC, Wet Prep HPF POC MANY (A) NONE SEEN   Sperm NONE SEEN    O/Positive/-- (07/26 0000)  Imaging:    MAU Course/MDM: Severe range BPs, treated with 3 doses of IV labetalol (20, 40, 80) Labs wnl & pt asymptomatic. GHTN vs CHTN d/t elevated BP at 18 wks C/w Dr. Adrian BlackwaterStinson who spoke with NICU -- will admit Discussed patient with Dr. Ashok PallWouk to determine which unit -- pt ok to go to antenatal   Assessment: 1. Gestational hypertension, third trimester   2. GDM, class A1   3. [redacted] weeks gestation of pregnancy     Plan: Obs bed on antenatal to follow BPs BMZ in case need for delivery <37 wks    Kathryn Hornrin Walfred Bettendorf, FNP 12/14/2017 9:06 AM

## 2017-12-14 NOTE — Progress Notes (Signed)
Large leuks on UA- patient reports no dysuria but increased frequency

## 2017-12-15 ENCOUNTER — Telehealth (HOSPITAL_COMMUNITY): Payer: Self-pay | Admitting: *Deleted

## 2017-12-15 ENCOUNTER — Other Ambulatory Visit: Payer: Self-pay | Admitting: Obstetrics & Gynecology

## 2017-12-15 DIAGNOSIS — O133 Gestational [pregnancy-induced] hypertension without significant proteinuria, third trimester: Principal | ICD-10-CM

## 2017-12-15 DIAGNOSIS — Z3A35 35 weeks gestation of pregnancy: Secondary | ICD-10-CM | POA: Diagnosis not present

## 2017-12-15 DIAGNOSIS — O1413 Severe pre-eclampsia, third trimester: Secondary | ICD-10-CM | POA: Diagnosis present

## 2017-12-15 DIAGNOSIS — O24410 Gestational diabetes mellitus in pregnancy, diet controlled: Secondary | ICD-10-CM | POA: Diagnosis not present

## 2017-12-15 LAB — GC/CHLAMYDIA PROBE AMP (~~LOC~~) NOT AT ARMC
Chlamydia: NEGATIVE
Neisseria Gonorrhea: NEGATIVE

## 2017-12-15 LAB — GLUCOSE, CAPILLARY
GLUCOSE-CAPILLARY: 121 mg/dL — AB (ref 65–99)
GLUCOSE-CAPILLARY: 112 mg/dL — AB (ref 65–99)
Glucose-Capillary: 140 mg/dL — ABNORMAL HIGH (ref 65–99)

## 2017-12-15 NOTE — Progress Notes (Signed)
Per Dr. Erin FullingHarraway-Smith, patient is to be discharged following 1345 administration of BMZ.  RN to notify MD of elevated pressures >160/100

## 2017-12-15 NOTE — Treatment Plan (Signed)
   Induction Assessment Scheduling Form: Fax to [facility]:  [phone]  Kathryn Wilkins                                                                                   DOB:  1986/08/10                                                            MRN:  [id number]                                                                     Phone #:     [phone]                       [facility]  [facility]  GP:  [id number]                                                            Estimated Date of Delivery: 01/12/18  Dating Criteria: 12 week sonogram    Medical Indications for induction:  Gestational Hypertension Admission Date/Time:  12/23/2016@0630  Gestational age on admission:  554w1d   Filed Weights   12/14/17 1248 12/15/17 0357  Weight: 226 lb 4 oz (102.6 kg) 221 lb 0.3 oz (100.3 kg)   HIV:  Non-reactive (07/26 0000) GEX:BMWUXLKGBS:pending    Cervical exam pending   Method of induction(proposed):  Cytotec/foley/pitocin   Scheduling Provider Signature:  Lazaro ArmsLuther H Benito Lemmerman, MD                                            Today's Date:  12/15/2017

## 2017-12-15 NOTE — Progress Notes (Signed)
Discharge instructions reviewed with patient. IV removed. Paperwork signed.

## 2017-12-15 NOTE — Progress Notes (Signed)
Patients blood sugar was not crossing over to the flowsheet. Blood sugar is 112.

## 2017-12-15 NOTE — Progress Notes (Signed)
RN not to discharge patient at this time per Dr. Erin FullingHarraway-Smith.  Continue to monitor vital signs q2hrs.  Encourage patient to ambulate.

## 2017-12-15 NOTE — Discharge Summary (Signed)
OB Discharge Summary     Patient Name: Kathryn Wilkins DOB: 08-02-1986 MRN: [id number]  Date of admission: 12/14/2017 Delivering MD: This patient has no babies on file.  Date of discharge: 12/15/2017  Admitting diagnosis: 35.6WKS HBP Intrauterine pregnancy: 1530w0d     Secondary diagnosis:  Active Problems:   Gestational hypertension w/o significant proteinuria in 3rd trimester  Additional problems:      Discharge diagnosis: Gestational Hypertension                                                                                                Post partum procedures:  Augmentation: AROM, Pitocin, Cytotec and Foley Balloon  Complications: None  Hospital course:  Induction of Labor With Vaginal Delivery   31 y.o. yo G2P0111 at 3239w6d was admitted to the hospital 12/14/2017 for induction of labor.  Indication for induction: Gestational hypertension.  Patient had an uncomplicated labor course as follows: Membrane Rupture Time/Date: 4:24 AM ,12/21/2017   Intrapartum Procedures: Episiotomy: None [1]                                         Lacerations:  2nd degree [3]  Patient had delivery of a Viable infant.  Information for the patient's newborn:  Hermenia BersJones, Boy Kortni [[id number]][[id number]]  Delivery Method: Vag-Spont   12/21/2017  Details of delivery can be found in separate delivery note.  Patient had a routine postpartum course. Patient is discharged home 12/25/17.  Physical exam  Vitals:   12/14/17 1937 12/14/17 2234 12/14/17 2355 12/15/17 0357  BP: (!) 153/90  (!) 149/68 139/69  Pulse: 95  99 100  Resp: 18  18 18   Temp: 97.9 F (36.6 C)  98.4 F (36.9 C) 98.3 F (36.8 C)  TempSrc: Oral  Oral Oral  SpO2: 100% 98% 99% 100%  Weight:    221 lb 0.3 oz (100.3 kg)  Height:       General: alert, cooperative and no distress Lochia: appropriate Uterine Fundus: firm Incision:  DVT Evaluation: No evidence of DVT seen on physical exam. Labs: Lab Results  Component Value Date   WBC 9.5  12/14/2017   HGB 9.9 (L) 12/14/2017   HCT 31.7 (L) 12/14/2017   MCV 64.3 (L) 12/14/2017   PLT 206 12/14/2017   CMP Latest Ref Rng & Units 12/14/2017  Glucose 65 - 99 mg/dL 85  BUN 6 - 20 mg/dL 6  Creatinine 6.960.44 - 2.951.00 mg/dL 2.840.59  Sodium 132135 - 440145 mmol/L 136  Potassium 3.5 - 5.1 mmol/L 4.0  Chloride 101 - 111 mmol/L 110  CO2 22 - 32 mmol/L 17(L)  Calcium 8.9 - 10.3 mg/dL 1.0(U8.8(L)  Total Protein 6.5 - 8.1 g/dL 6.5  Total Bilirubin 0.3 - 1.2 mg/dL 0.3  Alkaline Phos 38 - 126 U/L 192(H)  AST 15 - 41 U/L 22  ALT 14 - 54 U/L 13(L)    Discharge instruction: per After Visit Summary and "Baby and Me Booklet".  After visit meds:    Diet: routine  diet  Activity: Advance as tolerated. Pelvic rest for 6 weeks.   Outpatient follow up:1 week Follow up Appt: Future Appointments  Date Time Provider Department Center  12/21/2017  1:35 PM [place] BingPickens, Charlie, MD [facility]  12/23/2017  6:30 AM [facility] None   Follow up Visit:No Follow-up on file.  Postpartum contraception: Undecided  Newborn Data: This patient has no babies on file. Baby Feeding: Breast Disposition:home with mother   12/15/2017 Lazaro ArmsLuther H Eure, MD

## 2017-12-15 NOTE — Telephone Encounter (Signed)
Preadmission screen  

## 2017-12-15 NOTE — Discharge Instructions (Signed)
Preeclampsia and Eclampsia °Preeclampsia is a serious condition that develops only during pregnancy. It is also called toxemia of pregnancy. This condition causes high blood pressure along with other symptoms, such as swelling and headaches. These symptoms may develop as the condition gets worse. Preeclampsia may occur at 20 weeks of pregnancy or later. °Diagnosing and treating preeclampsia early is very important. If not treated early, it can cause serious problems for you and your baby. One problem it can lead to is eclampsia, which is a condition that causes muscle jerking or shaking (convulsions or seizures) in the mother. Delivering your baby is the best treatment for preeclampsia or eclampsia. Preeclampsia and eclampsia symptoms usually go away after your baby is born. °What are the causes? °The cause of preeclampsia is not known. °What increases the risk? °The following risk factors make you more likely to develop preeclampsia: °· Being pregnant for the first time. °· Having had preeclampsia during a past pregnancy. °· Having a family history of preeclampsia. °· Having high blood pressure. °· Being pregnant with twins or triplets. °· Being 35 or older. °· Being African-American. °· Having kidney disease or diabetes. °· Having medical conditions such as lupus or blood diseases. °· Being very overweight (obese). ° °What are the signs or symptoms? °The earliest signs of preeclampsia are: °· High blood pressure. °· Increased protein in your urine. Your health care provider will check for this at every visit before you give birth (prenatal visit). ° °Other symptoms that may develop as the condition gets worse include: °· Severe headaches. °· Sudden weight gain. °· Swelling of the hands, face, legs, and feet. °· Nausea and vomiting. °· Vision problems, such as blurred or double vision. °· Numbness in the face, arms, legs, and feet. °· Urinating less than usual. °· Dizziness. °· Slurred speech. °· Abdominal pain,  especially upper abdominal pain. °· Convulsions or seizures. ° °Symptoms generally go away after giving birth. °How is this diagnosed? °There are no screening tests for preeclampsia. Your health care provider will ask you about symptoms and check for signs of preeclampsia during your prenatal visits. You may also have tests that include: °· Urine tests. °· Blood tests. °· Checking your blood pressure. °· Monitoring your baby’s heart rate. °· Ultrasound. ° °How is this treated? °You and your health care provider will determine the treatment approach that is best for you. Treatment may include: °· Having more frequent prenatal exams to check for signs of preeclampsia, if you have an increased risk for preeclampsia. °· Bed rest. °· Reducing how much salt (sodium) you eat. °· Medicine to lower your blood pressure. °· Staying in the hospital, if your condition is severe. There, treatment will focus on controlling your blood pressure and the amount of fluids in your body (fluid retention). °· You may need to take medicine (magnesium sulfate) to prevent seizures. This medicine may be given as an injection or through an IV tube. °· Delivering your baby early, if your condition gets worse. You may have your labor started with medicine (induced), or you may have a cesarean delivery. ° °Follow these instructions at home: °Eating and drinking ° °· Drink enough fluid to keep your urine clear or pale yellow. °· Eat a healthy diet that is low in sodium. Do not add salt to your food. Check nutrition labels to see how much sodium a food or beverage contains. °· Avoid caffeine. °Lifestyle °· Do not use any products that contain nicotine or tobacco, such as cigarettes   and e-cigarettes. If you need help quitting, ask your health care provider. °· Do not use alcohol or drugs. °· Avoid stress as much as possible. Rest and get plenty of sleep. °General instructions °· Take over-the-counter and prescription medicines only as told by your  health care provider. °· When lying down, lie on your side. This keeps pressure off of your baby. °· When sitting or lying down, raise (elevate) your feet. Try putting some pillows underneath your lower legs. °· Exercise regularly. Ask your health care provider what kinds of exercise are best for you. °· Keep all follow-up and prenatal visits as told by your health care provider. This is important. °How is this prevented? °To prevent preeclampsia or eclampsia from developing during another pregnancy: °· Get proper medical care during pregnancy. Your health care provider may be able to prevent preeclampsia or diagnose and treat it early. °· Your health care provider may have you take a low-dose aspirin or a calcium supplement during your next pregnancy. °· You may have tests of your blood pressure and kidney function after giving birth. °· Maintain a healthy weight. Ask your health care provider for help managing weight gain during pregnancy. °· Work with your health care provider to manage any long-term (chronic) health conditions you have, such as diabetes or kidney problems. ° °Contact a health care provider if: °· You gain more weight than expected. °· You have headaches. °· You have nausea or vomiting. °· You have abdominal pain. °· You feel dizzy or light-headed. °Get help right away if: °· You develop sudden or severe swelling anywhere in your body. This usually happens in the legs. °· You gain 5 lbs (2.3 kg) or more during one week. °· You have severe: °? Abdominal pain. °? Headaches. °? Dizziness. °? Vision problems. °? Confusion. °? Nausea or vomiting. °· You have a seizure. °· You have trouble moving any part of your body. °· You develop numbness in any part of your body. °· You have trouble speaking. °· You have any abnormal bleeding. °· You pass out. °This information is not intended to replace advice given to you by your health care provider. Make sure you discuss any questions you have with your health  care provider. °Document Released: 12/03/2000 Document Revised: 08/03/2016 Document Reviewed: 07/12/2016 °Elsevier Interactive Patient Education © 2018 [employer] ° °

## 2017-12-16 ENCOUNTER — Encounter: Payer: Self-pay | Admitting: Obstetrics and Gynecology

## 2017-12-16 DIAGNOSIS — Z22330 Carrier of Group B streptococcus: Secondary | ICD-10-CM | POA: Insufficient documentation

## 2017-12-16 LAB — CULTURE, BETA STREP (GROUP B ONLY)

## 2017-12-16 LAB — URINE CULTURE, OB REFLEX

## 2017-12-16 LAB — CULTURE, OB URINE

## 2017-12-19 ENCOUNTER — Ambulatory Visit (INDEPENDENT_AMBULATORY_CARE_PROVIDER_SITE_OTHER): Payer: Medicaid Other | Admitting: *Deleted

## 2017-12-19 ENCOUNTER — Encounter (HOSPITAL_COMMUNITY): Payer: Self-pay | Admitting: *Deleted

## 2017-12-19 ENCOUNTER — Encounter: Payer: Self-pay | Admitting: Advanced Practice Midwife

## 2017-12-19 ENCOUNTER — Ambulatory Visit (INDEPENDENT_AMBULATORY_CARE_PROVIDER_SITE_OTHER): Payer: Medicaid Other | Admitting: Advanced Practice Midwife

## 2017-12-19 ENCOUNTER — Inpatient Hospital Stay (HOSPITAL_COMMUNITY)
Admission: AD | Admit: 2017-12-19 | Discharge: 2017-12-23 | DRG: 807 | Disposition: A | Discharge status: Home / Self Care | Payer: Medicaid Other | Source: Ambulatory Visit | Attending: Family Medicine | Admitting: Family Medicine

## 2017-12-19 VITALS — BP 166/104 | HR 84 | Wt 226.0 lb

## 2017-12-19 DIAGNOSIS — Z3A36 36 weeks gestation of pregnancy: Secondary | ICD-10-CM | POA: Diagnosis not present

## 2017-12-19 DIAGNOSIS — O1414 Severe pre-eclampsia complicating childbirth: Secondary | ICD-10-CM | POA: Diagnosis present

## 2017-12-19 DIAGNOSIS — Z87891 Personal history of nicotine dependence: Secondary | ICD-10-CM | POA: Diagnosis not present

## 2017-12-19 DIAGNOSIS — O99019 Anemia complicating pregnancy, unspecified trimester: Secondary | ICD-10-CM | POA: Diagnosis present

## 2017-12-19 DIAGNOSIS — D649 Anemia, unspecified: Secondary | ICD-10-CM | POA: Diagnosis present

## 2017-12-19 DIAGNOSIS — O9902 Anemia complicating childbirth: Secondary | ICD-10-CM | POA: Diagnosis present

## 2017-12-19 DIAGNOSIS — O0990 Supervision of high risk pregnancy, unspecified, unspecified trimester: Secondary | ICD-10-CM

## 2017-12-19 DIAGNOSIS — O24410 Gestational diabetes mellitus in pregnancy, diet controlled: Secondary | ICD-10-CM

## 2017-12-19 DIAGNOSIS — O24419 Gestational diabetes mellitus in pregnancy, unspecified control: Secondary | ICD-10-CM

## 2017-12-19 DIAGNOSIS — Z22330 Carrier of Group B streptococcus: Secondary | ICD-10-CM

## 2017-12-19 DIAGNOSIS — O1413 Severe pre-eclampsia, third trimester: Secondary | ICD-10-CM | POA: Diagnosis present

## 2017-12-19 DIAGNOSIS — O0930 Supervision of pregnancy with insufficient antenatal care, unspecified trimester: Secondary | ICD-10-CM

## 2017-12-19 DIAGNOSIS — O133 Gestational [pregnancy-induced] hypertension without significant proteinuria, third trimester: Secondary | ICD-10-CM

## 2017-12-19 DIAGNOSIS — O24420 Gestational diabetes mellitus in childbirth, diet controlled: Secondary | ICD-10-CM | POA: Diagnosis present

## 2017-12-19 DIAGNOSIS — O0993 Supervision of high risk pregnancy, unspecified, third trimester: Secondary | ICD-10-CM

## 2017-12-19 DIAGNOSIS — O99824 Streptococcus B carrier state complicating childbirth: Secondary | ICD-10-CM | POA: Diagnosis present

## 2017-12-19 LAB — COMPREHENSIVE METABOLIC PANEL
ALK PHOS: 192 U/L — AB (ref 38–126)
ALT: 19 U/L (ref 14–54)
AST: 25 U/L (ref 15–41)
Albumin: 2.6 g/dL — ABNORMAL LOW (ref 3.5–5.0)
Anion gap: 9 (ref 5–15)
BILIRUBIN TOTAL: 0.5 mg/dL (ref 0.3–1.2)
BUN: 5 mg/dL — ABNORMAL LOW (ref 6–20)
CALCIUM: 8.1 mg/dL — AB (ref 8.9–10.3)
CO2: 18 mmol/L — ABNORMAL LOW (ref 22–32)
CREATININE: 0.58 mg/dL (ref 0.44–1.00)
Chloride: 108 mmol/L (ref 101–111)
Glucose, Bld: 81 mg/dL (ref 65–99)
Potassium: 3.7 mmol/L (ref 3.5–5.1)
Sodium: 135 mmol/L (ref 135–145)
Total Protein: 6 g/dL — ABNORMAL LOW (ref 6.5–8.1)

## 2017-12-19 LAB — TYPE AND SCREEN
ABO/RH(D): O POS
Antibody Screen: NEGATIVE

## 2017-12-19 LAB — CBC
HEMATOCRIT: 27.8 % — AB (ref 36.0–46.0)
HEMOGLOBIN: 9 g/dL — AB (ref 12.0–15.0)
MCH: 20.2 pg — AB (ref 26.0–34.0)
MCHC: 32.4 g/dL (ref 30.0–36.0)
MCV: 62.5 fL — AB (ref 78.0–100.0)
Platelets: 194 10*3/uL (ref 150–400)
RBC: 4.45 MIL/uL (ref 3.87–5.11)
RDW: 17.5 % — ABNORMAL HIGH (ref 11.5–15.5)
WBC: 11.8 10*3/uL — ABNORMAL HIGH (ref 4.0–10.5)

## 2017-12-19 LAB — GLUCOSE, CAPILLARY: Glucose-Capillary: 82 mg/dL (ref 65–99)

## 2017-12-19 LAB — PROTEIN / CREATININE RATIO, URINE
CREATININE, URINE: 136 mg/dL
Protein Creatinine Ratio: 0.39 mg/mg{Cre} — ABNORMAL HIGH (ref 0.00–0.15)
TOTAL PROTEIN, URINE: 53 mg/dL

## 2017-12-19 MED ORDER — PENICILLIN G POT IN DEXTROSE 60000 UNIT/ML IV SOLN
3.0000 10*6.[IU] | INTRAVENOUS | Status: DC
  Administered 2017-12-19 – 2017-12-21 (×11): 3 10*6.[IU] via INTRAVENOUS
  Filled 2017-12-19 (×12): qty 50

## 2017-12-19 MED ORDER — PENICILLIN G POTASSIUM 5000000 UNITS IJ SOLR
5.0000 10*6.[IU] | Freq: Once | INTRAVENOUS | Status: AC
  Administered 2017-12-19: 5 10*6.[IU] via INTRAVENOUS
  Filled 2017-12-19: qty 5

## 2017-12-19 MED ORDER — ACETAMINOPHEN 325 MG PO TABS: 650.0000 mg | ORAL_TABLET | ORAL | Status: DC | PRN

## 2017-12-19 MED ORDER — MISOPROSTOL 25 MCG QUARTER TABLET
25.0000 ug | ORAL_TABLET | ORAL | Status: DC
  Administered 2017-12-19: 25 ug via VAGINAL
  Filled 2017-12-19: qty 1

## 2017-12-19 MED ORDER — FLEET ENEMA 7-19 GM/118ML RE ENEM: 1.0000 | ENEMA | RECTAL | Status: DC | PRN

## 2017-12-19 MED ORDER — OXYTOCIN BOLUS FROM INFUSION
500.0000 mL | Freq: Once | INTRAVENOUS | Status: AC
  Administered 2017-12-21: 500 mL via INTRAVENOUS

## 2017-12-19 MED ORDER — MISOPROSTOL 50MCG HALF TABLET
50.0000 ug | ORAL_TABLET | ORAL | Status: DC | PRN
  Administered 2017-12-19 – 2017-12-20 (×4): 50 ug via BUCCAL
  Filled 2017-12-19 (×5): qty 1

## 2017-12-19 MED ORDER — OXYCODONE-ACETAMINOPHEN 5-325 MG PO TABS: 1.0000 | ORAL_TABLET | ORAL | Status: DC | PRN

## 2017-12-19 MED ORDER — OXYCODONE-ACETAMINOPHEN 5-325 MG PO TABS: 2.0000 | ORAL_TABLET | ORAL | Status: DC | PRN

## 2017-12-19 MED ORDER — TERBUTALINE SULFATE 1 MG/ML IJ SOLN: 0.2500 mg | Freq: Once | INTRAMUSCULAR | Status: DC | PRN

## 2017-12-19 MED ORDER — MAGNESIUM SULFATE 40 G IN LACTATED RINGERS - SIMPLE
2.0000 g/h | INTRAVENOUS | Status: AC
  Administered 2017-12-19 – 2017-12-21 (×4): 2 g/h via INTRAVENOUS
  Filled 2017-12-19 (×4): qty 40

## 2017-12-19 MED ORDER — MAGNESIUM SULFATE BOLUS VIA INFUSION
4.0000 g | Freq: Once | INTRAVENOUS | Status: AC
  Administered 2017-12-19: 4 g via INTRAVENOUS
  Filled 2017-12-19: qty 500

## 2017-12-19 MED ORDER — PENICILLIN G POTASSIUM 5000000 UNITS IJ SOLR
5.0000 10*6.[IU] | Freq: Once | INTRAMUSCULAR | Status: DC
  Filled 2017-12-19: qty 5

## 2017-12-19 MED ORDER — OXYTOCIN 40 UNITS IN LACTATED RINGERS INFUSION - SIMPLE MED: 2.5000 [IU]/h | INTRAVENOUS | Status: DC

## 2017-12-19 MED ORDER — LABETALOL HCL 5 MG/ML IV SOLN
20.0000 mg | INTRAVENOUS | Status: AC | PRN
  Administered 2017-12-19: 40 mg via INTRAVENOUS
  Administered 2017-12-19: 20 mg via INTRAVENOUS
  Filled 2017-12-19 (×2): qty 4

## 2017-12-19 MED ORDER — LACTATED RINGERS IV SOLN
INTRAVENOUS | Status: DC
  Administered 2017-12-19 – 2017-12-21 (×5): via INTRAVENOUS

## 2017-12-19 MED ORDER — LIDOCAINE HCL (PF) 1 % IJ SOLN
30.0000 mL | INTRAMUSCULAR | Status: AC | PRN
  Administered 2017-12-21: 30 mL via SUBCUTANEOUS
  Filled 2017-12-19: qty 30

## 2017-12-19 MED ORDER — OXYTOCIN 40 UNITS IN LACTATED RINGERS INFUSION - SIMPLE MED
1.0000 m[IU]/min | INTRAVENOUS | Status: DC
  Administered 2017-12-20: 2 m[IU]/min via INTRAVENOUS
  Filled 2017-12-19: qty 1000

## 2017-12-19 MED ORDER — SOD CITRATE-CITRIC ACID 500-334 MG/5ML PO SOLN: 30.0000 mL | ORAL | Status: DC | PRN

## 2017-12-19 MED ORDER — ONDANSETRON HCL 4 MG/2ML IJ SOLN: 4.0000 mg | Freq: Four times a day (QID) | INTRAMUSCULAR | Status: DC | PRN

## 2017-12-19 MED ORDER — HYDRALAZINE HCL 20 MG/ML IJ SOLN
5.0000 mg | INTRAMUSCULAR | Status: AC | PRN
  Administered 2017-12-20: 10 mg via INTRAVENOUS
  Administered 2017-12-20: 5 mg via INTRAVENOUS
  Filled 2017-12-19: qty 1

## 2017-12-19 MED ORDER — LACTATED RINGERS IV SOLN
500.0000 mL | INTRAVENOUS | Status: DC | PRN
  Administered 2017-12-20: 500 mL via INTRAVENOUS

## 2017-12-19 MED ORDER — PENICILLIN G POTASSIUM 5000000 UNITS IJ SOLR
3.0000 10*6.[IU] | INTRAMUSCULAR | Status: DC
  Filled 2017-12-19: qty 5

## 2017-12-19 NOTE — Progress Notes (Signed)
   PRENATAL VISIT NOTE  Subjective:  Kathryn Wilkins is a 31 y.o. G2P0010 at 4234w4d being seen today for ongoing prenatal care.  She is currently monitored for the following issues for this high-risk pregnancy and has Late prenatal care; GDM, class A1; Supervision of high risk pregnancy, antepartum; Gestational hypertension, third trimester; Anemia in pregnancy; Gestational hypertension w/o significant proteinuria in 3rd trimester; Gestational hypertension; and GBS carrier on their problem list.  Patient reports no complaints.  Denies HA, vision changes or epigastric pain.  Contractions: Not present. Vag. Bleeding: None.  Movement: Present. Denies leaking of fluid.   The following portions of the patient's history were reviewed and updated as appropriate: allergies, current medications, past family history, past medical history, past social history, past surgical history and problem list. Problem list updated.  Objective:   Vitals:   12/19/17 0748 12/19/17 0824  BP: (!) 163/93 (!) 166/104  Pulse: 84   Weight: 226 lb (102.5 kg)    NST Reactive Baseline: 120 bpm, Variability: Good {> 6 bpm), Accelerations: Reactive and Decelerations: Absent none  Fetal Status: Fetal Heart Rate (bpm): NST Fundal Height: 39 cm Movement: Present  Presentation: Vertex  General:  Alert, oriented and cooperative. Patient is in no acute distress.  Skin: Skin is warm and dry. No rash noted.   Cardiovascular: Normal heart rate noted  Respiratory: Normal respiratory effort, no problems with respiration noted  Abdomen: Soft, gravid, appropriate for gestational age.  Pain/Pressure: Absent     Pelvic: Cervical exam deferred        Extremities: Normal range of motion.     Mental Status:  Normal mood and affect. Normal behavior. Normal judgment and thought content.   CBGs in range  Assessment and Plan:  Pregnancy: G2P0010 at 8534w4d  1. Supervision of high risk pregnancy, antepartum   2. GDM, class A1   3. Pre-E  w/ severe features (BP), third trimester - Direct admit for IOL per consult w/ Dr. Earlene Plateravis.    Dorathy KinsmanVirginia Annalina Needles, CNM

## 2017-12-19 NOTE — Progress Notes (Signed)
Labor Progress Note Kathryn Wilkins is a 31 y.o. G2P0010 at 6340w4d presented for preeclampsia with severe features.  S: No complaints   O:  BP (!) 144/83   Pulse 89   Temp 98.1 F (36.7 C) (Oral)   Resp 16   Ht 5\' 4"  (1.626 m)   Wt 226 lb (102.5 kg)   LMP 04/05/2017   BMI 38.79 kg/m  EFM: 130 bpm/mod var/pos acels  CVE: Dilation: Closed Effacement (%): Thick Cervical Position: Posterior Station: -3 Presentation: Vertex Exam by:: Rachelle HoraMoss, MD   A&P: 31 y.o. G2P0010 3340w4d here for IOL for preeclampsia with severe features. #Labor: cervix remains closed. Change cytotec to vaginal q4hr prn. #preeclampsia: asymptomatic. Continue magnesium infusion. Blood pressure well controlled.  Kathryn BookbinderAmber Shalawn Wynder, DO 7:54 PM

## 2017-12-19 NOTE — H&P (Signed)
Obstetric History and Physical  Kathryn Headlandeayra Yetta Wilkins is a 31 y.o. G2P0010 with IUP at 499w4d presenting for IOL for severe preeclampsia with severe features (blood pressure). Seen in office today and Bps 160s/100s. Prenatal Course [facility] Pregnancy complications or risks: Patient Active Problem List   Diagnosis Date Noted  . 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 Gestational hypertension 12/15/2017  . Anemia in pregnancy 12/14/2017  . Gestational hypertension w/o significant proteinuria in 3rd trimester 12/14/2017  . Gestational hypertension, third trimester 12/08/2017  . Late prenatal care 07/25/2017  . GDM, class A1 07/25/2017  . Supervision of high risk pregnancy, antepartum 07/25/2017   She plans to breastfeed, plans to bottle feed She desires oral progesterone-only contraceptive for postpartum contraception.   Prenatal labs and studies: ABO, Rh: --/--/O POS, O POS (12/26 0920) Antibody: NEG (12/26 0920) Rubella: Immune (07/26 0000) RPR: Non Reactive (10/26 0919)  HBsAg:   pending HIV: Non Reactive (10/26 0919)  GBS: positive 1 hr Glucola  abnormal Anatomy US normal    Clinic [facility] Prenatal Labs  Dating 12 wk us Blood type: O/Positive/-- (07/26 0000)   Genetic Screen 1 Screen:    AFP:     Quad:     NIPS: Antibody:Negative (07/26 0000)  Anatomic US WNL Rubella: Immune (07/26 0000)  GTT Early:  153, abnl 3 hour RPR: Nonreactive (07/23 0000)   Flu vaccine declined HBsAg:     TDaP vaccine   declined                                        HIV: Non-reactive (07/26 0000)   Baby Food bottle                                           GBS: (Positive)  Contraception pills Pap:Neg 07/14/17  Circumcision If boy yes   Pediatrician Given list CF:Neg  Support Person Mother fob-(Rontae Gunn) SMA  Prenatal Classes  offered Hgb electrophoresis:     Prenatal Transfer Tool  Maternal Diabetes: Yes:  Diabetes Type:  Diet controlled Maternal Ultrasounds/Referrals: Normal Fetal Ultrasounds or  other Referrals:  None Maternal Substance Abuse:  No Significant Maternal Medications:  None Significant Maternal Lab Results: Lab values include: Group B Strep positive  Past Medical History:  Diagnosis Date  . Gestational diabetes   . Hypertension     Past Surgical History:  Procedure Laterality Date  . NO PAST SURGERIES      OB History  Gravida Para Term Preterm AB Living  2       1 0  SAB TAB Ectopic Multiple Live Births  1            # Outcome Date GA Lbr Len/2nd Weight Sex Delivery Anes PTL Lv  2 Current           1 SAB               Social History   Socioeconomic History  . Marital status: Single    Spouse name: None  . Number of children: None  . Years of education: None  . Highest education level: None  Social Needs  . Financial resource strain: None  . Food insecurity - worry: None  . Food insecurity - inability: None  . Transportation needs - medical:  None  . Transportation needs - non-medical: None  Occupational History  . None  Tobacco Use  . Smoking status: Former Games developermoker  . Smokeless tobacco: Never Used  Substance and Sexual Activity  . Alcohol use: No  . Drug use: Yes    Types: Marijuana    Comment: In the past since 06/2017  . Sexual activity: Yes  Other Topics Concern  . None  Social History Narrative  . None    Family History  Problem Relation Age of Onset  . Arthritis Mother   . Hypertension Mother   . Drug abuse Father   . Arthritis Maternal Grandmother   . Asthma Maternal Grandmother   . Diabetes Maternal Grandmother   . Hypertension Maternal Grandmother   . Cancer Maternal Grandfather   . Diabetes Paternal Grandmother     Medications Prior to Admission  Medication Sig Dispense Refill Last Dose  . aspirin EC 81 MG tablet Take 1 tablet (81 mg total) by mouth daily. (Patient not taking: Reported on 12/19/2017) 90 tablet 3 Not Taking  . Prenatal Vit-Fe Fumarate-FA (PRENATAL COMPLETE) 14-0.4 MG TABS Take 0.4 mg by mouth daily.  (Patient not taking: Reported on 12/19/2017) 60 each 0 Not Taking    No Known Allergies  Review of Systems: Negative except for what is mentioned in HPI.  Physical Exam: LMP 04/05/2017  CONSTITUTIONAL: Well-developed, well-nourished female in no acute distress.  HENT:  Normocephalic, atraumatic, External right and left ear normal. Oropharynx is clear and moist EYES: Conjunctivae and EOM are normal. Pupils are equal, round, and reactive to light. No scleral icterus.  NECK: Normal range of motion, supple, no masses SKIN: Skin is warm and dry. No rash noted. Not diaphoretic. No erythema. No pallor. NEUROLOGIC: Alert and oriented to person, place, and time. Normal reflexes, muscle tone coordination. No cranial nerve deficit noted. PSYCHIATRIC: Normal mood and affect. Normal behavior. Normal judgment and thought content. CARDIOVASCULAR: Normal heart rate noted, regular rhythm RESPIRATORY: Effort and breath sounds normal, no problems with respiration noted ABDOMEN: Soft, nontender, nondistended, gravid. MUSCULOSKELETAL: Normal range of motion. No edema and no tenderness. 2+ distal pulses.  Cervical Exam: Dilatation: closed/thick/high Presentation: cephalic FHT:  Baseline rate 130 bpm   Variability moderate  Accelerations present   Decelerations none    Pertinent Labs/Studies:   No results found for this or any previous visit (from the past 24 hour(s)).  Assessment : Kathryn Wilkins is a 31 y.o. G2P0010 at 2723w4d being admitted for induction of labor due to preeclampsia with severe features.  Plan: Labor:  Induction/Augmentation as ordered as per protocol. Analgesia as needed. FWB: Reassuring fetal heart tracing.  GBS positive Delivery plan: Hopeful for vaginal delivery A1GDM- INITIAL CBG 82 MOF-bottle and breast MOC- POPs Outpatient circumcision  Rolm BookbinderAmber Khristin Keleher, DO

## 2017-12-19 NOTE — Patient Instructions (Signed)
Augmentation of Labor Augmentation of labor is when steps are taken to stimulate and strengthen uterine contractions during labor. This may be done when the contractions have slowed down or stopped, delaying progress of labor and delivery of the baby. Before beginning augmentation of labor, the health care provider will evaluate the condition of the mother and baby, the size and position of the baby, and the size of the birth canal. What are the reasons for labor augmentation? Reasons for augmentation of labor include:  Slow labor (prolonged first and second stage of labor) that has been associated with increased maternal risks, such as chorioamnionitis, postpartum hemorrhage, operative vaginal delivery, or third-degree or fourth-degree perineal lacerations.  Decreased average length of labor.  What methods are used for labor augmentation? Various methods may be used for augmentation of labor, including:  Oxytocin medicine. This medicine stimulates contractions. It is given through an IV access tube inserted into a vein.  Breaking the fluid-filled sac that surrounds the fetus (amniotic sac).  Stripping the membranes. The health care provider separates amniotic sac tissue from the cervix, causing the release of a hormone called progesterone that can stimulate uterine contractions.  Nipple stimulation.  Stimulation of certain pressure points on the ankles.  Manual or mechanical dilation of the cervix.  What are the risks associated with labor augmentation?  Overstimulation of the uterine contractions (continuous, prolonged, very strong contractions), causing fetal distress.  Increased chance of infection for the mother and baby.  Uterine tearing (rupture).  Breaking off (abruption) of the placenta.  Increased chance of cesarean, forceps, or vacuum delivery. What are some reasons for not doing labor augmentation? Augmentation of labor should not be done if:  The baby is too big for  the birth canal. This can be confirmed by ultrasonography.  The umbilical cord drops in front of the baby's head or breech part (prolapsed cord).  The mother had a previous cesarean delivery with a vertical incision in the uterus (or the kind of incision used is not known). High dose oxytocin should not be used if the mother had a previous cesarean delivery of any kind.  The mother had previous surgery on or into the uterus.  The mother has herpes.  The mother has cervical cancer.  The baby is lying sideways.  The mother's pelvis is deformed.  The mother is pregnant with more than two babies.  This information is not intended to replace advice given to you by your health care provider. Make sure you discuss any questions you have with your health care provider. Document Released: 05/31/2007 Document Revised: 05/19/2016 Document Reviewed: 07/05/2013 Elsevier Interactive Patient Education  2017 Elsevier Inc.  

## 2017-12-19 NOTE — Progress Notes (Signed)
Pt had inpatient stay last week - was D/C'd on 12/27. Today she denies H/A or visual disturbances.  She states her BP @ home has been 140-150/70-80. IOL scheduled on 1/4 @ 0630.

## 2017-12-19 NOTE — Anesthesia Pain Management Evaluation Note (Signed)
  CRNA Pain Management Visit Note  Patient: Kathryn Wilkins, 31 y.o., female  "Hello I am a member of the anesthesia team at [facility]. We have an anesthesia team available at all times to provide care throughout the hospital, including epidural management and anesthesia for C-section. I don't know your plan for the delivery whether it a natural birth, water birth, IV sedation, nitrous supplementation, doula or epidural, but we want to meet your pain goals."   1.Was your pain managed to your expectations on prior hospitalizations?   Yes   2.What is your expectation for pain management during this hospitalization?     Labor support without medications  3.How can we help you reach that goal? natural  Record the patient's initial score and the patient's pain goal.   Pain: 0  Pain Goal: 8 [facility] wants you to be able to say your pain was always managed very well.  Thomasene Dubow 12/19/2017

## 2017-12-19 NOTE — Progress Notes (Signed)
Patient is 31 yo G2P0010 @ 3762w4d admitted for induction of labor for worsening gestational HTN noted at prenatal visit this am. She required IV meds for BP control and was started on MgSO4 this am. Reviewed possibility of neonatal transfer to another facility in the event baby requires NICU admission given current census. Gave patient option of transfer to another facility prior to induction to prevent possible separation of mother and baby. Patient and her mother verbalize understanding and request that she stay here for delivery, and they understand possibility that if neonate requires NICU admission, the neonate would likely be transferred. Answered all questions.    Baldemar LenisK. Meryl Davis, M.D. Attending Obstetrician & Gynecologist, [facility], [facility]

## 2017-12-19 NOTE — Progress Notes (Signed)
Called Health department and left message re: hepatitis  B results.

## 2017-12-20 ENCOUNTER — Other Ambulatory Visit: Payer: Self-pay

## 2017-12-20 LAB — HEPATITIS B E ANTIGEN: Hep B E Ag: NEGATIVE

## 2017-12-20 LAB — HEPATITIS B SURFACE ANTIGEN: Hepatitis B Surface Ag: NEGATIVE

## 2017-12-20 LAB — RPR: RPR Ser Ql: NONREACTIVE

## 2017-12-20 MED ORDER — LABETALOL HCL 5 MG/ML IV SOLN
20.0000 mg | INTRAVENOUS | Status: AC | PRN
  Administered 2017-12-20: 40 mg via INTRAVENOUS
  Administered 2017-12-20: 20 mg via INTRAVENOUS
  Filled 2017-12-20: qty 8

## 2017-12-20 MED ORDER — LABETALOL HCL 5 MG/ML IV SOLN
INTRAVENOUS | Status: AC
  Filled 2017-12-20: qty 4

## 2017-12-20 MED ORDER — BUTORPHANOL TARTRATE 1 MG/ML IJ SOLN
2.0000 mg | Freq: Once | INTRAMUSCULAR | Status: AC
  Administered 2017-12-20: 2 mg via INTRAVENOUS
  Filled 2017-12-20: qty 2

## 2017-12-20 MED ORDER — FENTANYL CITRATE (PF) 100 MCG/2ML IJ SOLN
100.0000 ug | INTRAMUSCULAR | Status: DC | PRN
  Administered 2017-12-20 – 2017-12-21 (×3): 100 ug via INTRAVENOUS
  Filled 2017-12-20 (×3): qty 2

## 2017-12-20 MED ORDER — PROMETHAZINE HCL 25 MG/ML IJ SOLN
12.5000 mg | Freq: Four times a day (QID) | INTRAMUSCULAR | Status: DC | PRN
  Administered 2017-12-20 – 2017-12-21 (×2): 12.5 mg via INTRAVENOUS
  Filled 2017-12-20 (×2): qty 1

## 2017-12-20 NOTE — L&D Delivery Note (Signed)
Delivery Note Patient progressed rapidly from 5 to complete. Fetal head crowning with no pushing effort. After one push at 9:58 AM a viable female was delivered via  (Presentation:LOA ).  APGAR: 8, 9; weight  pending.   Placenta status: schultz , complete/intact.  Cord:  with the following complications: none .  Cord pH: NA  Anesthesia:  Epidural, local  Episiotomy:  none Lacerations:  2nd degree Suture Repair: 3.0 monocryl  Est. Blood Loss (mL):  350cc  Mom to postpartum.  Baby to Couplet care / Skin to Skin.  Thressa ShellerHeather Loreal Schuessler 12/21/2017, 10:22 AM

## 2017-12-20 NOTE — Progress Notes (Signed)
Kathryn Wilkins is a 32 y.o. G2P0010 at 1113w5d admitted for induction of labor due to Pre-eclamptic toxemia of pregnancy..  Subjective:  Coping well with contractions. Considering narcotic pain medication, not ready for epidural yet.  Objective: BP (!) 155/75   Pulse 99   Temp 97.6 F (36.4 C) (Oral)   Resp 20   Ht 5\' 4"  (1.626 m)   Wt 226 lb (102.5 kg)   LMP 04/05/2017   SpO2 100%   BMI 38.79 kg/m  I/O last 3 completed shifts: In: 5148.3 [P.O.:2095; I.V.:2603.3; IV Piggyback:450] Out: 3510 [Urine:3510] Total I/O In: 2170 [P.O.:770; I.V.:1250; IV Piggyback:150] Out: 1250 [Urine:1250]  FHT:  FHR: 140 bpm, variability: moderate,  accelerations:  Present,  decelerations:  Absent UC:   irregular, every 5+ minutes SVE:   Dilation: 2 Effacement (%): 100 Station: -3 Exam by:: Kathryn Wilkins CNM   Bedside US: VERTEX  Labs: Lab Results  Component Value Date   WBC 11.8 (H) 12/19/2017   HGB 9.0 (L) 12/19/2017   HCT 27.8 (L) 12/19/2017   MCV 62.5 (L) 12/19/2017   PLT 194 12/19/2017    Assessment / Plan: IOL for pre-eclampsia. FB still in place, making change. Will start pitocin now.   Labor: Will start pitocin  Preeclampsia:  on magnesium sulfate Fetal Wellbeing:  Category I Pain Control:  Labor support without medications I/D:  n/a Anticipated MOD:  NSVD  Kathryn Wilkins 12/20/2017, 5:44 PM

## 2017-12-20 NOTE — Progress Notes (Signed)
Labor Progress Note Kathryn Wilkins is a 32 y.o. G2P0010 at 724w5d presented for IOL for preeclampsia with severe features. S: No complaints  O:  BP (!) 160/88   Pulse 88   Temp 98.4 F (36.9 C) (Oral)   Resp 16   Ht 5\' 4"  (1.626 m)   Wt 226 lb (102.5 kg)   LMP 04/05/2017   BMI 38.79 kg/m  EFM: 130 bpm/mod var/pos acels  CVE: Dilation: 1 Effacement (%): Thick Cervical Position: Posterior Station: -3 Presentation: Vertex Exam by:: Dr. Rachelle HoraMoss    A&P: 32 y.o. G2P0010 664w5d here for IOL for preeclampsia with severe features. #Labor: Foley balloon placed. Continue cytotec q4hrprn #preeclampsia: severe range pressure 160/85-> given labetalol per protocol. Remains asymptomatic.  Brently Voorhis, DO 1:46 AM

## 2017-12-20 NOTE — Progress Notes (Signed)
Kathryn Wilkins is a 32 y.o. G2P0010 at 2630w5d admitted for induction of labor due to Pre-eclamptic toxemia of pregnancy..  Subjective:  No complaints at this time. No pain or contractions. FB has been placed.  Objective: BP (!) 149/88   Pulse 97   Temp 97.6 F (36.4 C) (Oral)   Resp 18   Ht 5\' 4"  (1.626 m)   Wt 226 lb (102.5 kg)   LMP 04/05/2017   SpO2 100%   BMI 38.79 kg/m  I/O last 3 completed shifts: In: 5148.3 [P.O.:2095; I.V.:2603.3; IV Piggyback:450] Out: 3510 [Urine:3510] Total I/O In: 125 [I.V.:125] Out: 300 [Urine:300]  FHT:  FHR: 130 bpm, variability: moderate,  accelerations:  Present,  decelerations:  Absent UC:   none SVE:   Dilation: 1 Effacement (%): Thick Station: -3 Exam by:: Dr. Rachelle HoraMoss   Labs: Lab Results  Component Value Date   WBC 11.8 (H) 12/19/2017   HGB 9.0 (L) 12/19/2017   HCT 27.8 (L) 12/19/2017   MCV 62.5 (L) 12/19/2017   PLT 194 12/19/2017    Assessment / Plan: IOL 2/2 pre-eclampsia. FB placed, will give a dose of buccal cytotec. Will start pitocin PRN   Labor: Latent phase, FB in.  Preeclampsia:  on magnesium sulfate Fetal Wellbeing:  Category I Pain Control:  Labor support without medications I/D:  n/a Anticipated MOD:  NSVD  Thressa ShellerHeather Hogan 12/20/2017, 9:47 AM

## 2017-12-20 NOTE — Progress Notes (Signed)
Patient ID: Nile Deareayra Vonstein, female   DOB: October 08, 1986, 32 y.o.   MRN: [id number]  Pt appears to be resting- not disturbed; on mag sulfate  BP 131/70, other VSS FHR 130s, +10x10 accels, no decels Ctx irreg Cervical foley still in place- not examined; Pit at 118mu/min   IUP@36 .5wks Pre-e w/ severe features- BP stable now Cx favorable but foley still in place  Continue increasing Pit; anticipate foley to come out soon Anticipate SVD Mag x 24h PP  Iain Sawchuk CNM 12/20/2017

## 2017-12-20 NOTE — Progress Notes (Addendum)
Kathryn Wilkins is a 32 y.o. G2P0010 at 6448w5d  admitted for induction of labor due to Pre-eclamptic toxemia of pregnancy..  Subjective: Patient getting more uncomfortable. Still coping well. She has gotten up to use the bathroom twice, and has been unable to void. Then, upon standing she has released a large amount of urine.   Patient has required 2 doses of hydralazine at this time. She had 2 doses of labetalol early in the induction course.   Objective: BP (!) 146/80   Pulse 94   Temp 98.6 F (37 C) (Oral)   Resp 18   Ht 5\' 4"  (1.626 m)   Wt 226 lb (102.5 kg)   LMP 04/05/2017   SpO2 100%   BMI 38.79 kg/m  I/O last 3 completed shifts: In: 5148.3 [P.O.:2095; I.V.:2603.3; IV Piggyback:450] Out: 3510 [Urine:3510] Total I/O In: 1505 [P.O.:530; I.V.:875; IV Piggyback:100] Out: 500 [Urine:500]  FHT:  FHR: 130 bpm, variability: moderate,  accelerations:  Present,  decelerations:  Absent UC:   irregular, every 8-8810minutes SVE:   Dilation: 1 Effacement (%): Thick Station: -3 Exam by:: Dr. Rachelle HoraMoss   Patient refuses cervical exam at this time.  Labs: Lab Results  Component Value Date   WBC 11.8 (H) 12/19/2017   HGB 9.0 (L) 12/19/2017   HCT 27.8 (L) 12/19/2017   MCV 62.5 (L) 12/19/2017   PLT 194 12/19/2017    Assessment / Plan: IOL pre-eclampsia. FB still in, has had 2 doses of cytotec since FB was placed.   Labor: Plan to start pitocin around 1730  Preeclampsia:  on magnesium sulfate Fetal Wellbeing:  Category I Pain Control:  Labor support without medications I/D:  n/a Anticipated MOD:  NSVD   DW patient that FB may be pressing on urethra or baby's position could be impacting her ability to void as well. She declines a cervical exam at this time. Will continue to monitor. Fluid does appear to be urine, and not amniotic fluid.   Thressa ShellerHeather Hogan 12/20/2017, 2:32 PM

## 2017-12-21 ENCOUNTER — Inpatient Hospital Stay (HOSPITAL_COMMUNITY): Payer: Medicaid Other | Admitting: Anesthesiology

## 2017-12-21 ENCOUNTER — Other Ambulatory Visit: Payer: Self-pay

## 2017-12-21 ENCOUNTER — Encounter (HOSPITAL_COMMUNITY): Payer: Self-pay | Admitting: Obstetrics

## 2017-12-21 ENCOUNTER — Encounter: Payer: Medicaid Other | Admitting: Obstetrics and Gynecology

## 2017-12-21 DIAGNOSIS — Z3A36 36 weeks gestation of pregnancy: Secondary | ICD-10-CM

## 2017-12-21 DIAGNOSIS — O1414 Severe pre-eclampsia complicating childbirth: Secondary | ICD-10-CM

## 2017-12-21 DIAGNOSIS — O99824 Streptococcus B carrier state complicating childbirth: Secondary | ICD-10-CM

## 2017-12-21 LAB — CBC
HEMATOCRIT: 31.2 % — AB (ref 36.0–46.0)
HEMOGLOBIN: 9.9 g/dL — AB (ref 12.0–15.0)
MCH: 19.9 pg — ABNORMAL LOW (ref 26.0–34.0)
MCHC: 31.7 g/dL (ref 30.0–36.0)
MCV: 62.8 fL — AB (ref 78.0–100.0)
Platelets: 215 10*3/uL (ref 150–400)
RBC: 4.97 MIL/uL (ref 3.87–5.11)
RDW: 17.5 % — ABNORMAL HIGH (ref 11.5–15.5)
WBC: 18.1 10*3/uL — ABNORMAL HIGH (ref 4.0–10.5)

## 2017-12-21 MED ORDER — DIBUCAINE 1 % RE OINT: 1.0000 "application " | TOPICAL_OINTMENT | RECTAL | Status: DC | PRN

## 2017-12-21 MED ORDER — HYDRALAZINE HCL 20 MG/ML IJ SOLN
INTRAMUSCULAR | Status: AC
  Administered 2017-12-21: 10 mg via INTRAVENOUS
  Filled 2017-12-21: qty 1

## 2017-12-21 MED ORDER — DIPHENHYDRAMINE HCL 25 MG PO CAPS: 25.0000 mg | ORAL_CAPSULE | Freq: Four times a day (QID) | ORAL | Status: DC | PRN

## 2017-12-21 MED ORDER — LIDOCAINE HCL (PF) 1 % IJ SOLN
INTRAMUSCULAR | Status: DC | PRN
  Administered 2017-12-21: 13 mL via EPIDURAL

## 2017-12-21 MED ORDER — PHENYLEPHRINE 40 MCG/ML (10ML) SYRINGE FOR IV PUSH (FOR BLOOD PRESSURE SUPPORT)
80.0000 ug | PREFILLED_SYRINGE | INTRAVENOUS | Status: DC | PRN
  Filled 2017-12-21: qty 5
  Filled 2017-12-21: qty 10

## 2017-12-21 MED ORDER — ZOLPIDEM TARTRATE 5 MG PO TABS: 5.0000 mg | ORAL_TABLET | Freq: Every evening | ORAL | Status: DC | PRN

## 2017-12-21 MED ORDER — TETANUS-DIPHTH-ACELL PERTUSSIS 5-2.5-18.5 LF-MCG/0.5 IM SUSP: 0.5000 mL | Freq: Once | INTRAMUSCULAR | Status: DC

## 2017-12-21 MED ORDER — BENZOCAINE-MENTHOL 20-0.5 % EX AERO
1.0000 "application " | INHALATION_SPRAY | CUTANEOUS | Status: DC | PRN
  Administered 2017-12-21: 1 via TOPICAL
  Filled 2017-12-21: qty 56

## 2017-12-21 MED ORDER — EPHEDRINE 5 MG/ML INJ
10.0000 mg | INTRAVENOUS | Status: DC | PRN
  Filled 2017-12-21: qty 2

## 2017-12-21 MED ORDER — LABETALOL HCL 5 MG/ML IV SOLN
20.0000 mg | INTRAVENOUS | Status: AC | PRN
  Administered 2017-12-21 – 2017-12-22 (×2): 20 mg via INTRAVENOUS
  Filled 2017-12-21: qty 8
  Filled 2017-12-21 (×2): qty 4

## 2017-12-21 MED ORDER — PRENATAL MULTIVITAMIN CH
1.0000 | ORAL_TABLET | Freq: Every day | ORAL | Status: DC
  Administered 2017-12-23: 1 via ORAL
  Filled 2017-12-21 (×2): qty 1

## 2017-12-21 MED ORDER — DIPHENHYDRAMINE HCL 50 MG/ML IJ SOLN: 12.5000 mg | INTRAMUSCULAR | Status: DC | PRN

## 2017-12-21 MED ORDER — PHENYLEPHRINE 40 MCG/ML (10ML) SYRINGE FOR IV PUSH (FOR BLOOD PRESSURE SUPPORT)
80.0000 ug | PREFILLED_SYRINGE | INTRAVENOUS | Status: DC | PRN
  Filled 2017-12-21: qty 5

## 2017-12-21 MED ORDER — ACETAMINOPHEN 325 MG PO TABS: 650.0000 mg | ORAL_TABLET | ORAL | Status: DC | PRN

## 2017-12-21 MED ORDER — ONDANSETRON HCL 4 MG/2ML IJ SOLN: 4.0000 mg | INTRAMUSCULAR | Status: DC | PRN

## 2017-12-21 MED ORDER — SENNOSIDES-DOCUSATE SODIUM 8.6-50 MG PO TABS
2.0000 | ORAL_TABLET | ORAL | Status: DC
  Administered 2017-12-21: 2 via ORAL
  Filled 2017-12-21 (×2): qty 2

## 2017-12-21 MED ORDER — FENTANYL 2.5 MCG/ML BUPIVACAINE 1/10 % EPIDURAL INFUSION (WH - ANES)
14.0000 mL/h | INTRAMUSCULAR | Status: DC | PRN
  Administered 2017-12-21: 14 mL/h via EPIDURAL
  Filled 2017-12-21: qty 100

## 2017-12-21 MED ORDER — HYDRALAZINE HCL 20 MG/ML IJ SOLN
5.0000 mg | INTRAMUSCULAR | Status: AC | PRN
  Administered 2017-12-21: 5 mg via INTRAVENOUS
  Administered 2017-12-21: 10 mg via INTRAVENOUS
  Filled 2017-12-21: qty 1

## 2017-12-21 MED ORDER — SIMETHICONE 80 MG PO CHEW: 80.0000 mg | CHEWABLE_TABLET | ORAL | Status: DC | PRN

## 2017-12-21 MED ORDER — LACTATED RINGERS IV SOLN
500.0000 mL | Freq: Once | INTRAVENOUS | Status: AC
  Administered 2017-12-21: 250 mL via INTRAVENOUS

## 2017-12-21 MED ORDER — WITCH HAZEL-GLYCERIN EX PADS: 1.0000 "application " | MEDICATED_PAD | CUTANEOUS | Status: DC | PRN

## 2017-12-21 MED ORDER — LABETALOL HCL 5 MG/ML IV SOLN: 20.0000 mg | INTRAVENOUS | Status: DC | PRN

## 2017-12-21 MED ORDER — COCONUT OIL OIL: 1.0000 "application " | TOPICAL_OIL | Status: DC | PRN

## 2017-12-21 MED ORDER — HYDRALAZINE HCL 20 MG/ML IJ SOLN
5.0000 mg | INTRAMUSCULAR | Status: AC | PRN
  Administered 2017-12-21: 5 mg via INTRAVENOUS
  Administered 2017-12-21: 10 mg via INTRAVENOUS

## 2017-12-21 MED ORDER — ONDANSETRON HCL 4 MG PO TABS: 4.0000 mg | ORAL_TABLET | ORAL | Status: DC | PRN

## 2017-12-21 MED ORDER — IBUPROFEN 600 MG PO TABS
600.0000 mg | ORAL_TABLET | Freq: Four times a day (QID) | ORAL | Status: DC
  Administered 2017-12-21 – 2017-12-23 (×9): 600 mg via ORAL
  Filled 2017-12-21 (×9): qty 1

## 2017-12-21 MED ORDER — LACTATED RINGERS IV SOLN
INTRAVENOUS | Status: DC
Start: 2017-12-21 — End: 2017-12-23
  Administered 2017-12-21 – 2017-12-22 (×3): via INTRAVENOUS

## 2017-12-21 MED ORDER — AMLODIPINE BESYLATE 10 MG PO TABS
10.0000 mg | ORAL_TABLET | Freq: Every day | ORAL | Status: DC
  Administered 2017-12-21 – 2017-12-23 (×3): 10 mg via ORAL
  Filled 2017-12-21 (×3): qty 1

## 2017-12-21 NOTE — Anesthesia Preprocedure Evaluation (Signed)
Anesthesia Evaluation  Patient identified by MRN, date of birth, ID band Patient awake    Reviewed: Allergy & Precautions, NPO status , Patient's Chart, lab work & pertinent test results  Airway Mallampati: II  TM Distance: >3 FB Neck ROM: Full    Dental no notable dental hx.    Pulmonary neg pulmonary ROS, former smoker,    Pulmonary exam normal breath sounds clear to auscultation       Cardiovascular hypertension, negative cardio ROS Normal cardiovascular exam Rhythm:Regular Rate:Normal     Neuro/Psych negative neurological ROS  negative psych ROS   GI/Hepatic negative GI ROS, Neg liver ROS,   Endo/Other  negative endocrine ROSdiabetes, Gestational  Renal/GU negative Renal ROS  negative genitourinary   Musculoskeletal negative musculoskeletal ROS (+)   Abdominal   Peds negative pediatric ROS (+)  Hematology negative hematology ROS (+)   Anesthesia Other Findings   Reproductive/Obstetrics negative OB ROS (+) Pregnancy                             Anesthesia Physical Anesthesia Plan  ASA: III  Anesthesia Plan: Epidural   Post-op Pain Management:    Induction:   PONV Risk Score and Plan:   Airway Management Planned:   Additional Equipment:   Intra-op Plan:   Post-operative Plan:   Informed Consent:   Plan Discussed with:   Anesthesia Plan Comments:         Anesthesia Quick Evaluation

## 2017-12-21 NOTE — Progress Notes (Signed)
Kathryn Wilkins is a 32 y.o. G2P0010 at 3760w6d admitted for induction of labor due to Pre-eclamptic toxemia of pregnancy..  Subjective:  Comfortable with epidural. No complaints.  Objective: BP (!) 149/104   Pulse 92   Temp 98.5 F (36.9 C) (Oral)   Resp 18   Ht 5\' 4"  (1.626 m)   Wt 226 lb (102.5 kg)   LMP 04/05/2017   SpO2 96%   BMI 38.79 kg/m  I/O last 3 completed shifts: In: 8381 [P.O.:2845; I.V.:5086; IV Piggyback:450] Out: 5500 [Urine:5500] Total I/O In: 381 [P.O.:60; I.V.:271; IV Piggyback:50] Out: 500 [Urine:500]  FHT:  FHR: 135 bpm, variability: minimal ,  accelerations:  Present,  decelerations:  Present 2 variables after epidural.  UC:   Difficult to trace with toco, IUPC placed. Assessing contraction pattern.  SVE:   Dilation: 5 Effacement (%): 70 Station: -2 Exam by:: Minus Libertyhristy Leshowitz, RN  Labs: Lab Results  Component Value Date   WBC 18.1 (H) 12/21/2017   HGB 9.9 (L) 12/21/2017   HCT 31.2 (L) 12/21/2017   MCV 62.8 (L) 12/21/2017   PLT 215 12/21/2017    Assessment / Plan: Induction of labor due to preeclampsia,  progressing well on pitocin  Labor: IUPC placed, will continue to assess contraction strength and increase pitocin PRN.  Preeclampsia:  on magnesium sulfate Fetal Wellbeing:  Category II Pain Control:  Epidural I/D:  n/a Anticipated MOD:  NSVD  Thressa ShellerHeather Hogan 12/21/2017, 9:22 AM

## 2017-12-21 NOTE — Progress Notes (Signed)
Patient ID: Kathryn Wilkins, female   DOB: Apr 30, 1986, 32 y.o.   MRN: [id number]  Cervical foley came out a little after midnight; Pit has been increased; still on mag sulfate  BPs 140/77, 166/95, 157/91 FHR 130s, 10x10accels, occ variables Ctx q 2-5 min w/ Pit at 4210mu/min Cx 5/70/-2; BBOW- AROM for pink-tinged fluid  IUP@36 .6wks Pre-e, severe Latent phase  Hopeful that AROM will increase labor Orders for prn antihypertensives renewed Plan to check cx when pain increases (no epid)  SHAW, KIMBERLY CNM 12/21/2017 4:31 AM

## 2017-12-21 NOTE — Anesthesia Procedure Notes (Signed)
Epidural Patient location during procedure: OB Start time: 12/21/2017 8:42 AM End time: 12/21/2017 8:57 AM  Staffing Anesthesiologist: Lowella CurbMiller, Sheehan Stacey Ray, MD Performed: anesthesiologist   Preanesthetic Checklist Completed: patient identified, site marked, surgical consent, pre-op evaluation, timeout performed, IV checked, risks and benefits discussed and monitors and equipment checked  Epidural Patient position: sitting Prep: ChloraPrep Patient monitoring: heart rate, cardiac monitor, continuous pulse ox and blood pressure Approach: midline Location: L2-L3 Injection technique: LOR saline  Needle:  Needle type: Tuohy  Needle gauge: 17 G Needle length: 9 cm Needle insertion depth: 8 cm Catheter type: closed end flexible Catheter size: 20 Guage Catheter at skin depth: 14 cm Test dose: negative  Assessment Events: blood not aspirated, injection not painful, no injection resistance, negative IV test and no paresthesia  Additional Notes Reason for block:procedure for pain

## 2017-12-21 NOTE — Anesthesia Postprocedure Evaluation (Signed)
Anesthesia Post Note  Patient: Kathryn Wilkins  Procedure(s) Performed: AN AD HOC LABOR EPIDURAL     Patient location during evaluation: Mother Baby Anesthesia Type: Epidural Level of consciousness: awake Pain management: satisfactory to patient Vital Signs Assessment: post-procedure vital signs reviewed and stable Respiratory status: spontaneous breathing Cardiovascular status: stable Anesthetic complications: no    Last Vitals:  Vitals:   12/21/17 1250 12/21/17 1350  BP: (!) 155/96 (!) 151/88  Pulse: 97 98  Resp: 18 18  Temp: 37.2 C 36.9 C  SpO2: 99% 98%    Last Pain:  Vitals:   12/21/17 1400  TempSrc:   PainSc: 0-No pain   Pain Goal:                 KeyCorpBURGER,Lakecia Deschamps

## 2017-12-22 LAB — GLUCOSE, CAPILLARY: GLUCOSE-CAPILLARY: 94 mg/dL (ref 65–99)

## 2017-12-22 LAB — BIRTH TISSUE RECOVERY COLLECTION (PLACENTA DONATION)

## 2017-12-22 NOTE — Progress Notes (Signed)
CSW received consult for hx of marijuana use.  Referral was screened out due to the following: ~MOB had no documented substance use after initial prenatal visit/+UPT. ~MOB had no positive drug screens after initial prenatal visit/+UPT. ~Baby's UDS is negative.  Please consult CSW if current concerns arise or by MOB's request.  CSW will monitor CDS results and make report to Child Protective Services if warranted. 

## 2017-12-22 NOTE — Progress Notes (Signed)
Post Partum Day 1, s/p IOL for preeclampsia with severe features , to d/c mag at 10 am today Subjective: no complaints, up ad lib, voiding, tolerating PO and + flatus  Objective: Blood pressure (!) 145/84, pulse 83, temperature (!) 97.5 F (36.4 C), temperature source Oral, resp. rate 18, height 5\' 4"  (1.626 m), weight 226 lb (102.5 kg), last menstrual period 04/05/2017, SpO2 97 %, unknown if currently breastfeeding.  Physical Exam:  General: alert, cooperative and no distress Lochia: appropriate Uterine Fundus: firm u-2 Incision:  DVT Evaluation: No evidence of DVT seen on physical exam.  Recent Labs    12/19/17 0950 12/21/17 0733  HGB 9.0* 9.9*  HCT 27.8* 31.2*    Assessment/Plan: Plan for discharge tomorrow Continue on Norvasc 10 q d D.c Mag this morning.  LOS: 3 days   Kathryn BurrowJohn V [name] 12/22/2017, 9:30 AM

## 2017-12-23 ENCOUNTER — Inpatient Hospital Stay (HOSPITAL_COMMUNITY): Payer: Medicaid Other

## 2017-12-23 MED ORDER — AMLODIPINE BESYLATE 10 MG PO TABS: 10.0000 mg | ORAL_TABLET | Freq: Every day | ORAL | 1 refills | Status: DC

## 2017-12-23 MED ORDER — IBUPROFEN 600 MG PO TABS: 600.0000 mg | ORAL_TABLET | Freq: Four times a day (QID) | ORAL | 0 refills | Status: AC

## 2017-12-23 MED ORDER — HYDROCHLOROTHIAZIDE 25 MG PO TABS: 25.0000 mg | ORAL_TABLET | Freq: Every day | ORAL | 3 refills | Status: AC

## 2017-12-23 NOTE — Discharge Instructions (Signed)

## 2017-12-23 NOTE — Discharge Summary (Signed)
OB Discharge Summary     Patient Name: Kathryn Wilkins DOB: 25-Feb-1986 MRN: [id number]  Date of admission: 12/19/2017 Delivering MD: Thressa Sheller D   Date of discharge: 12/23/2017  Admitting diagnosis: INDUCTION Intrauterine pregnancy: [redacted]w[redacted]d     Secondary diagnosis:  Principal Problem:   Supervision of high risk pregnancy, antepartum Active Problems:   Late prenatal care   GDM, class A1   Anemia in pregnancy   Preeclampsia, severe, third trimester   GBS carrier  Additional problems: none     Discharge diagnosis: Preterm Pregnancy Delivered, Preeclampsia (severe) and GDM A1                                                                                                Post partum procedures:none  Augmentation: AROM, Pitocin, Cytotec and Foley Balloon  Complications: None  Hospital course:  Induction of Labor With Vaginal Delivery   33 y.o. yo G2P0111 at [redacted]w[redacted]d was admitted to the hospital 12/19/2017 for induction of labor.  Indication for induction: Preeclampsia.  Patient had an uncomplicated labor course as follows: Membrane Rupture Time/Date: 4:24 AM ,12/21/2017   Intrapartum Procedures: Episiotomy: None [1]                                         Lacerations:  2nd degree [3]  Patient had delivery of a Viable infant.  Information for the patient's newborn:  Chaz, Ronning [[id number]]  Delivery Method: Vag-Spont   12/21/2017  Details of delivery can be found in separate delivery note.  Patient had a routine postpartum course. Patient is discharged home 12/23/17.  Physical exam  Vitals:   12/22/17 2121 12/22/17 2332 12/23/17 0432 12/23/17 0832  BP: 127/75 (!) 158/93 (!) 154/90 (!) 153/85  Pulse: 97 85 85 79  Resp:  18 18 18   Temp:  98.4 F (36.9 C) 98.7 F (37.1 C) 98.6 F (37 C)  TempSrc:  Oral Oral Oral  SpO2:  98% 99% 100%  Weight:   99.8 kg (220 lb 2 oz)   Height:       General: alert, cooperative and no distress Lochia: appropriate Uterine Fundus:  firm Incision: N/A DVT Evaluation: No evidence of DVT seen on physical exam. Labs: Lab Results  Component Value Date   WBC 18.1 (H) 12/21/2017   HGB 9.9 (L) 12/21/2017   HCT 31.2 (L) 12/21/2017   MCV 62.8 (L) 12/21/2017   PLT 215 12/21/2017   CMP Latest Ref Rng & Units 12/19/2017  Glucose 65 - 99 mg/dL 81  BUN 6 - 20 mg/dL 5(L)  Creatinine 7.82 - 1.00 mg/dL 9.56  Sodium 213 - 086 mmol/L 135  Potassium 3.5 - 5.1 mmol/L 3.7  Chloride 101 - 111 mmol/L 108  CO2 22 - 32 mmol/L 18(L)  Calcium 8.9 - 10.3 mg/dL 8.1(L)  Total Protein 6.5 - 8.1 g/dL 6.0(L)  Total Bilirubin 0.3 - 1.2 mg/dL 0.5  Alkaline Phos 38 - 126 U/L 192(H)  AST 15 - 41 U/L 25  ALT  14 - 54 U/L 19    Discharge instruction: per After Visit Summary and "Baby and Me Booklet".  After visit meds:  Allergies as of 12/23/2017   No Known Allergies     Medication List    TAKE these medications   amLODipine 10 MG tablet Commonly known as:  NORVASC Take 1 tablet (10 mg total) by mouth daily. Start taking on:  12/24/2017   aspirin EC 81 MG tablet Take 1 tablet (81 mg total) by mouth daily.   hydrochlorothiazide 25 MG tablet Commonly known as:  HYDRODIURIL Take 1 tablet (25 mg total) by mouth daily.   ibuprofen 600 MG tablet Commonly known as:  ADVIL,MOTRIN Take 1 tablet (600 mg total) by mouth every 6 (six) hours.   PRENATAL COMPLETE 14-0.4 MG Tabs Take 0.4 mg by mouth daily.       Diet: carb modified diet  Activity: Advance as tolerated. Pelvic rest for 6 weeks.   Outpatient follow up:BP check in 1 week Follow up Appt:No future appointments. Follow up Visit:No Follow-up on file.  Postpartum contraception: Progesterone only pills  Newborn Data: Live born female  Birth Weight: 5 lb 9.1 oz (2525 g) APGAR: 8, 9  Newborn Delivery   Birth date/time:  12/21/2017 09:58:00 Delivery type:  Vaginal, Spontaneous     Baby Feeding: Breast Disposition:home with mother   12/23/2017 Scheryl DarterJames Arnold, MD

## 2017-12-29 ENCOUNTER — Ambulatory Visit (HOSPITAL_COMMUNITY): Payer: Medicaid Other

## 2018-02-01 ENCOUNTER — Ambulatory Visit: Payer: Medicaid Other | Admitting: Advanced Practice Midwife

## 2018-02-07 ENCOUNTER — Encounter: Payer: Self-pay | Admitting: Obstetrics and Gynecology

## 2018-02-07 ENCOUNTER — Ambulatory Visit (INDEPENDENT_AMBULATORY_CARE_PROVIDER_SITE_OTHER): Payer: Medicaid Other | Admitting: Obstetrics and Gynecology

## 2018-02-07 ENCOUNTER — Encounter: Payer: Self-pay | Admitting: General Practice

## 2018-02-07 MED ORDER — NORGESTIMATE-ETH ESTRADIOL 0.25-35 MG-MCG PO TABS: 1.0000 | ORAL_TABLET | Freq: Every day | ORAL | 11 refills | Status: AC

## 2018-02-07 NOTE — Progress Notes (Signed)
Subjective:     Kathryn Wilkins is a 32 y.o. female who presents for a postpartum visit. She is 4 weeks postpartum following a spontaneous vaginal delivery. I have fully reviewed the prenatal and intrapartum course. Complicated by preeclampsia and A1GDM. The delivery was at 36 gestational weeks. Outcome: spontaneous vaginal delivery. Anesthesia: epidural. Postpartum course has been uncomplicated. Baby's course has been uncomplicated. Baby is feeding by bottle - neosure premature. Bleeding no bleeding. Bowel function is normal. Bladder function is normal. Patient is sexually active. Contraception method desired is pills. Postpartum depression screening: negative.  The following portions of the patient's history were reviewed and updated as appropriate: allergies, current medications, past family history, past medical history, past social history, past surgical history and problem list.  Review of Systems All systems reviewed and are negative for acute change except as noted in the HPI.  Objective:    BP (!) 140/99   Pulse (!) 102   Wt 197 lb (89.4 kg)   LMP 02/02/2017 (Exact Date)   Breastfeeding? No   BMI 33.81 kg/m   General:  alert, cooperative and no distress   Breasts:  negative  Lungs: clear to auscultation bilaterally  Heart:  regular rate and rhythm  Abdomen: soft, non-tender; bowel sounds normal; no masses,  no organomegaly   Vulva:  normal  Vagina: normal vagina        Assessment:     Normal postpartum exam. Pap smear not done at today's visit. Next due 06/2020.  Plan:    1. Contraception: OCP (estrogen/progesterone). Discussed risk of VTE with use. Recommended patient waiting until 6 weeks postpartum to start. Rx sent to pharmacy,  2. Preeclampsia: Continues to take BP medication. BP elevated mildly today. Continue medication. Asymptomatic. Continue monitoring BPs at home. 3. Follow up in: 4 weeks for BP check or as needed.    Caryl AdaJazma Phelps, DO OB Fellow [facility]

## 2018-02-08 LAB — POCT PREGNANCY, URINE: Preg Test, Ur: NEGATIVE

## 2018-03-07 ENCOUNTER — Ambulatory Visit: Payer: Medicaid Other

## 2018-03-07 VITALS — BP 149/87 | HR 73

## 2018-03-07 DIAGNOSIS — Z0130 Encounter for examination of blood pressure without abnormal findings: Secondary | ICD-10-CM

## 2018-03-07 NOTE — Progress Notes (Signed)
Pt presents to the office today for a BP check. BP is 149/87. Pt states that she has not taken her medication this morning. Pt also denies any blurred vision and headaches. Pt advised to continue medication as prescribed. Pt verbalized understanding.

## 2018-08-28 IMAGING — US US OB TRANSVAGINAL
1 series · 14 of 28 positions shown · non-contrast
Comparison: No prior .

CLINICAL DATA: Bladder fullness.

EXAM:
OBSTETRIC <14 WK US AND TRANSVAGINAL OB US
TECHNIQUE: Both transabdominal and transvaginal ultrasound examinations were
performed for complete evaluation of the gestation as well as the
maternal uterus, adnexal regions, and pelvic cul-de-sac.
Transvaginal technique was performed to assess early pregnancy.

[Series 1: us ob transvaginal · 0.27mm/px · 14 of 42 slices shown]
[im 2/42]
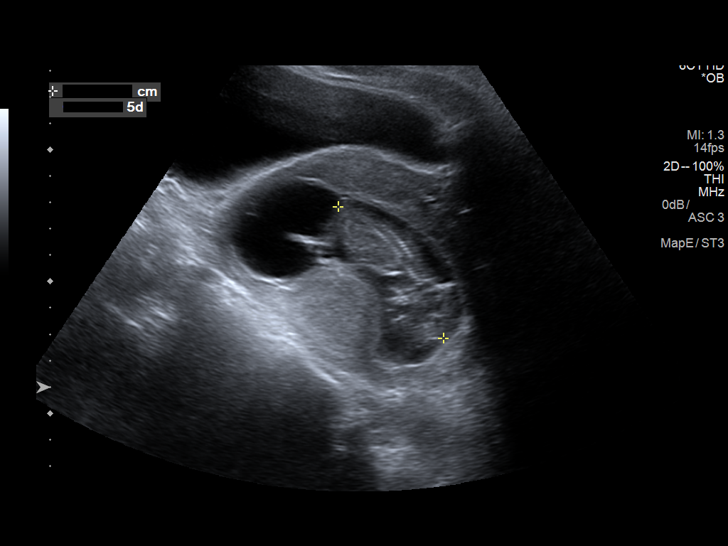
[im 5/42]
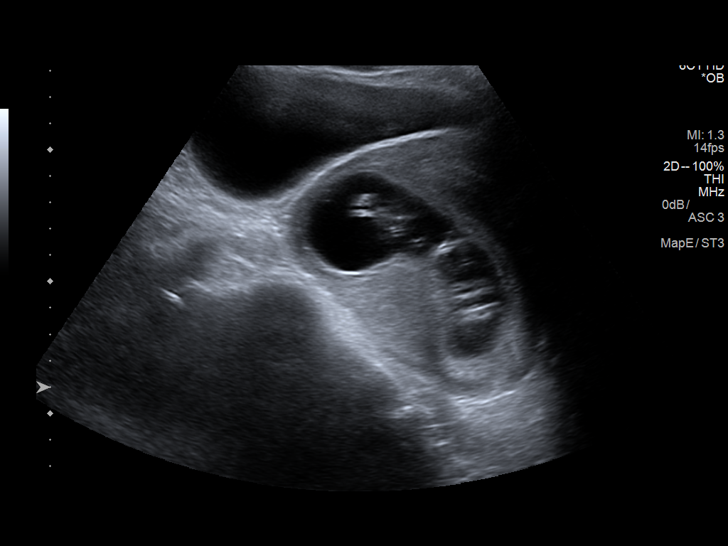
[im 8/42]
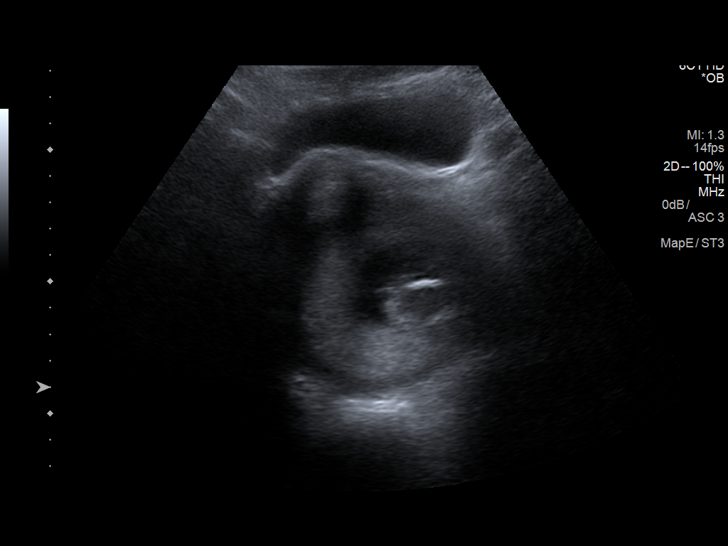
[im 11/42]
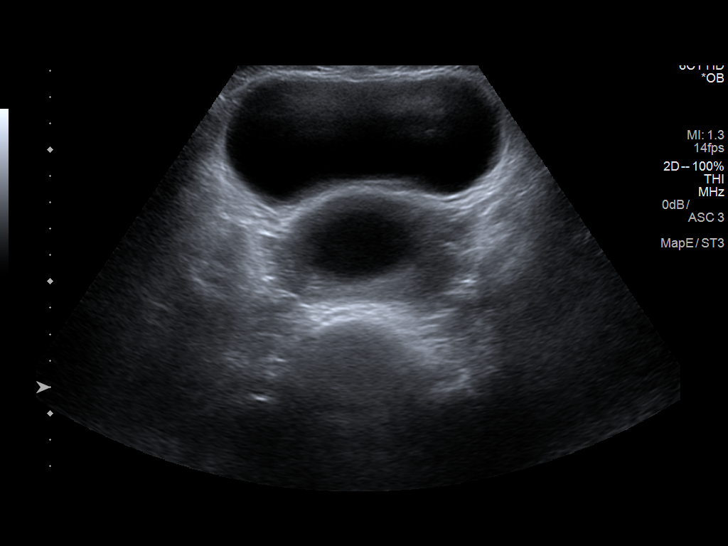
[im 14/42]
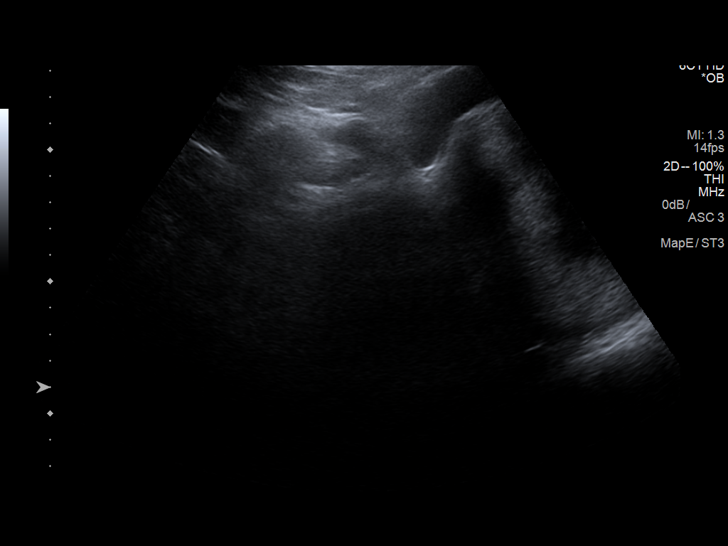
[im 17/42]
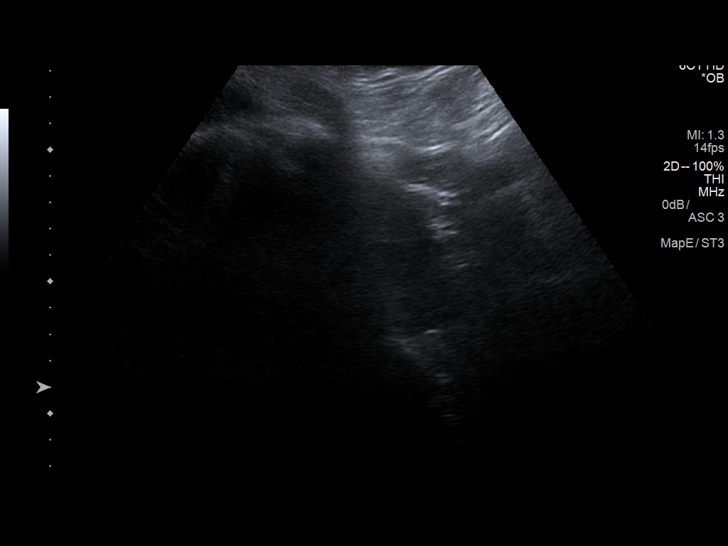
[im 20/42]
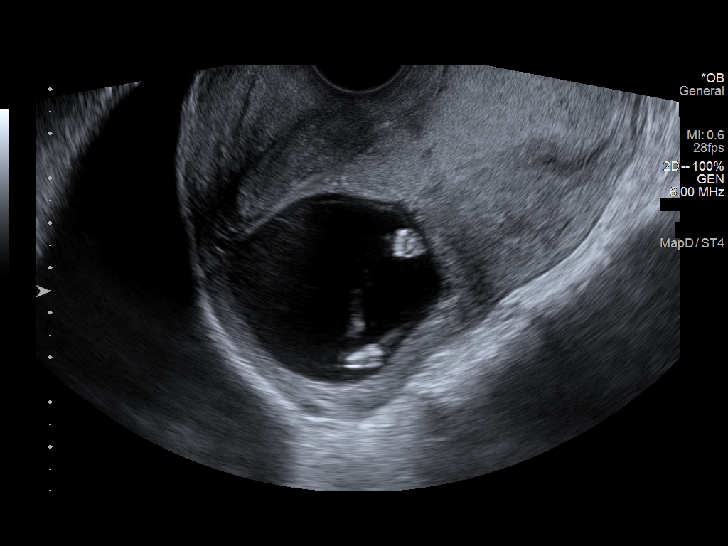
[im 23/42]
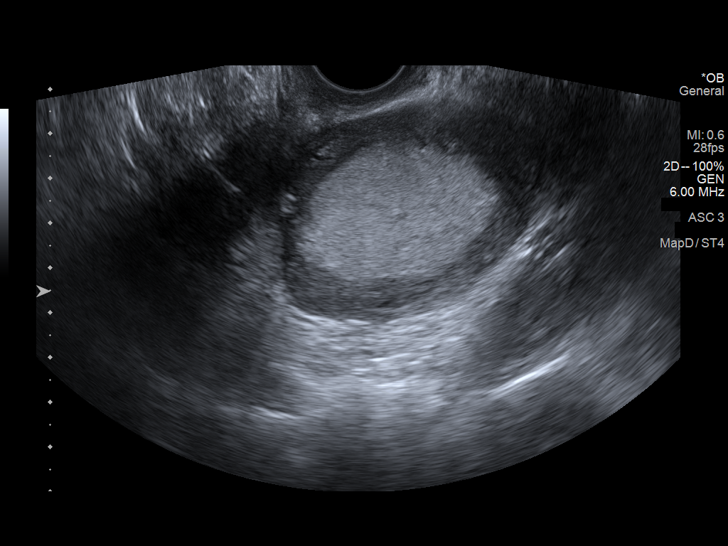
[im 26/42]
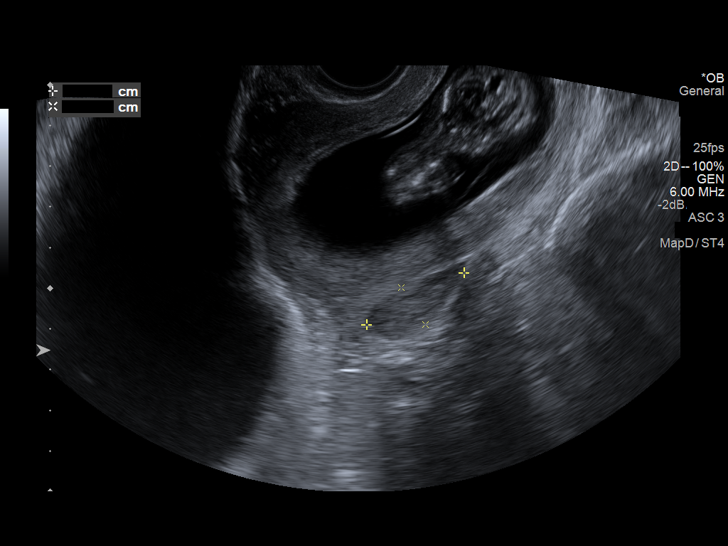
[im 29/42]
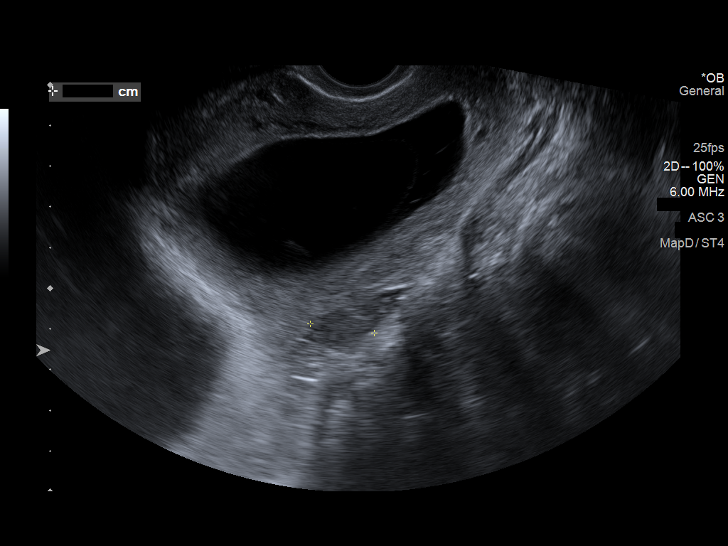
[im 32/42]
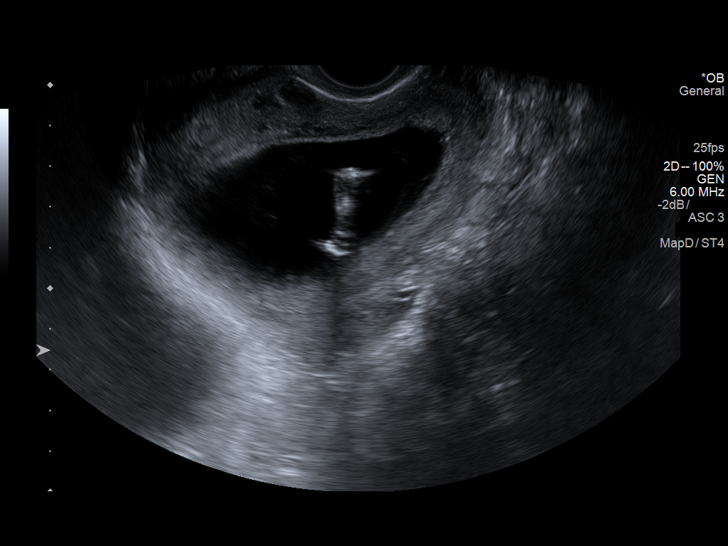
[im 35/42]
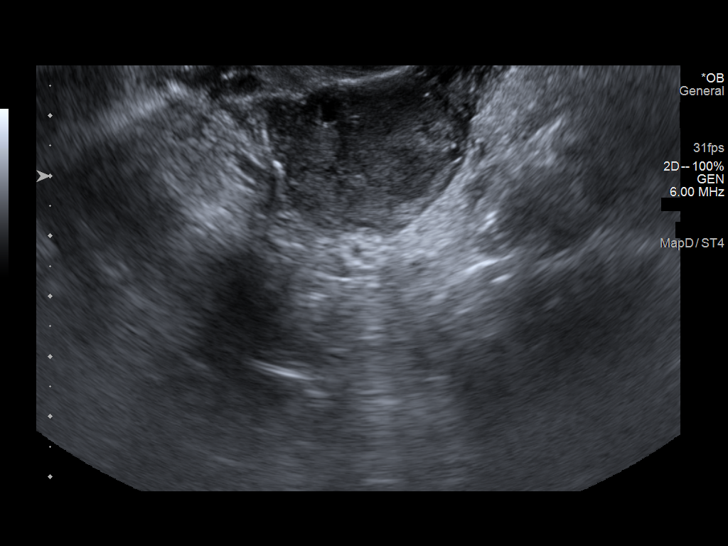
[im 38/42]
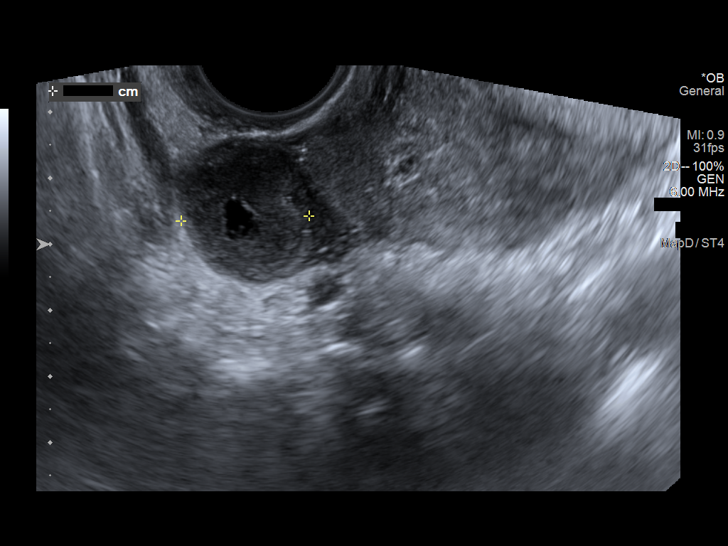
[im 42/42]
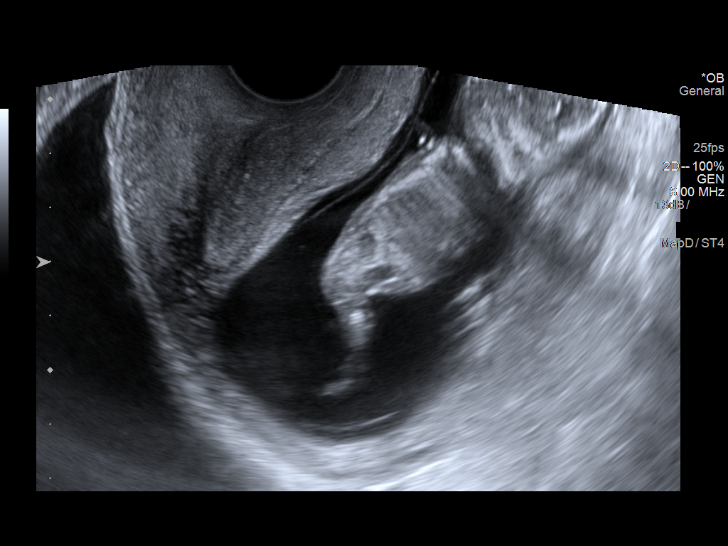

[14 of 28 positions shown; findings below may reference images not displayed]

FINDINGS: Intrauterine gestational sac: Single

Yolk sac:  Not visualized

Embryo:  Visualized

Cardiac Activity: Visualized

Heart Rate: 135  bpm

CRL:  63.8  mm   12 w   5 d                  US EDC: 01/12/2018

Subchorionic hemorrhage:  None visualized.

Maternal uterus/adnexae: 2 cm complex cyst, most likely corpus
luteal.
IMPRESSION: Single viable intrauterine pregnancy at 12 weeks 5 days.

## 2019-07-06 IMAGING — US US MFM OB FOLLOW-UP
1 series · 14 of 28 positions shown · non-contrast
Comparison: none

[Series 1: us mfm ob follow-up · 14 of 46 slices shown]
[im 2/46]
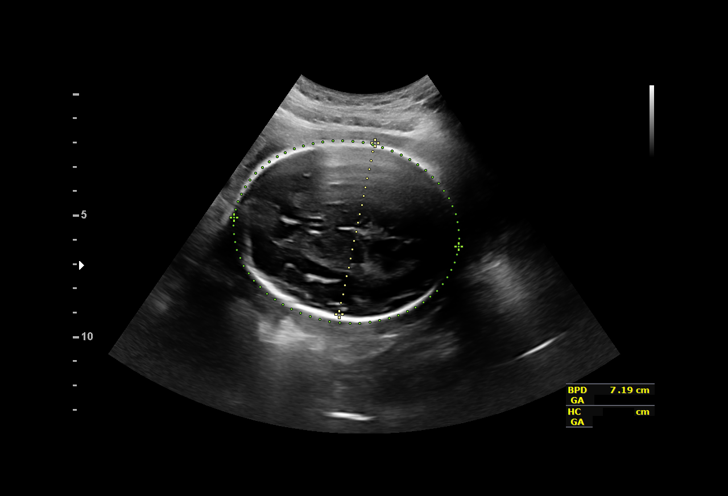
[im 6/46]
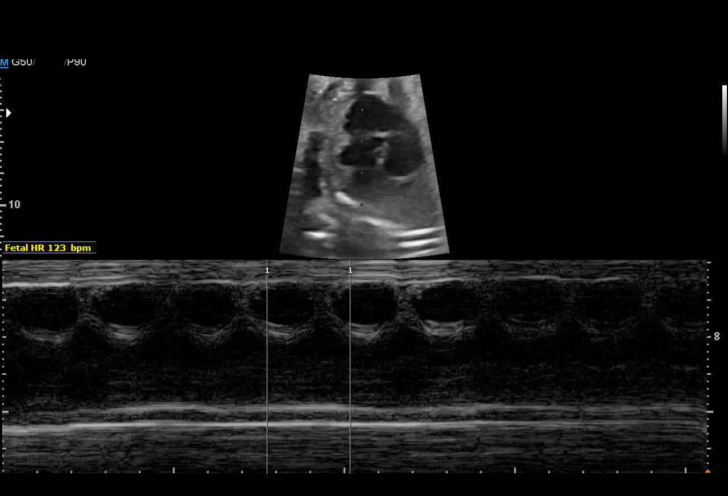
[im 9/46]
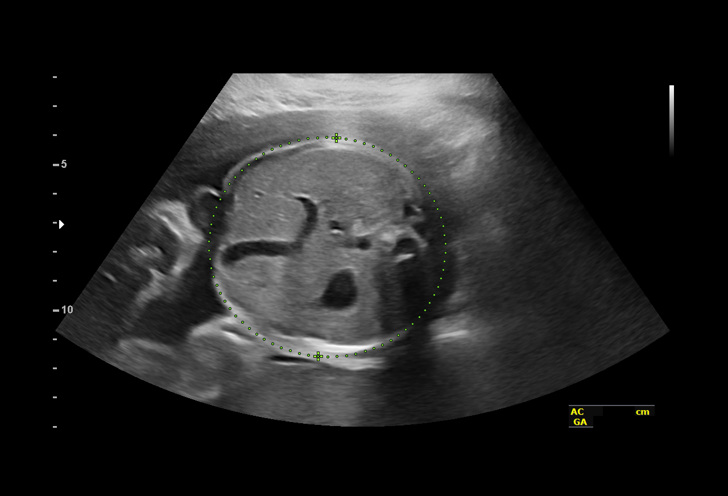
[im 12/46]
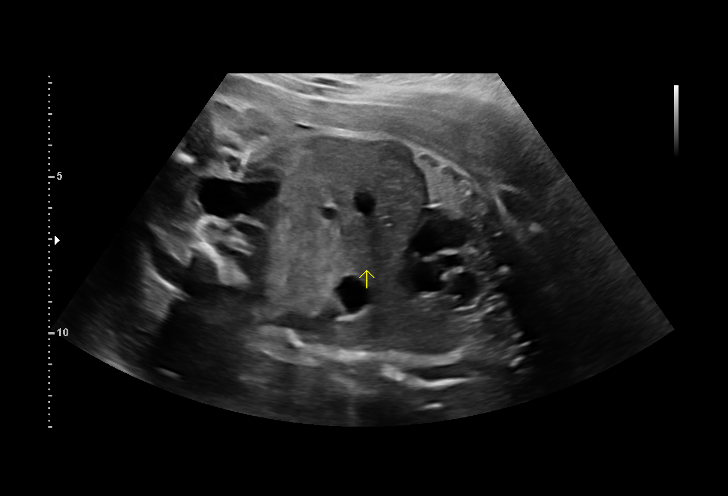
[im 16/46]
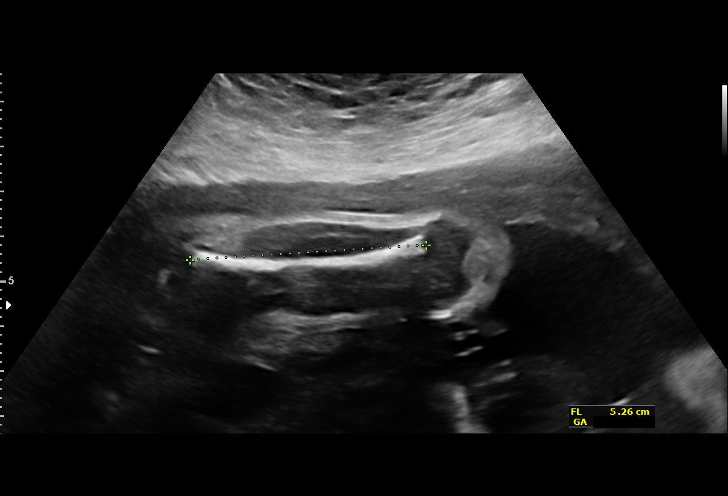
[im 19/46]
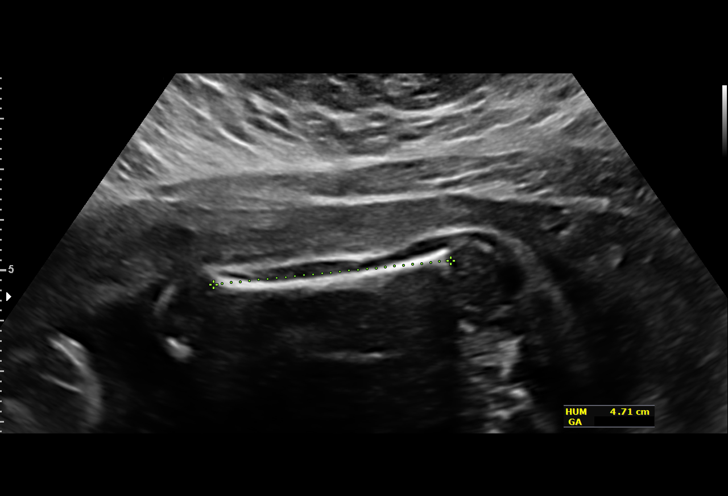
[im 22/46]
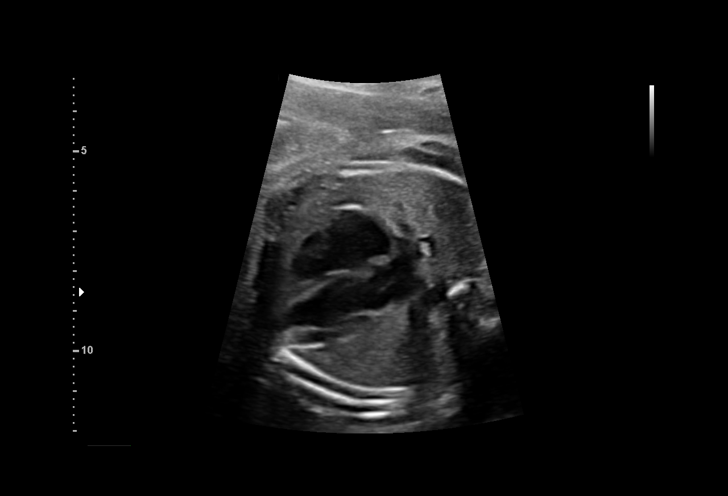
[im 26/46]
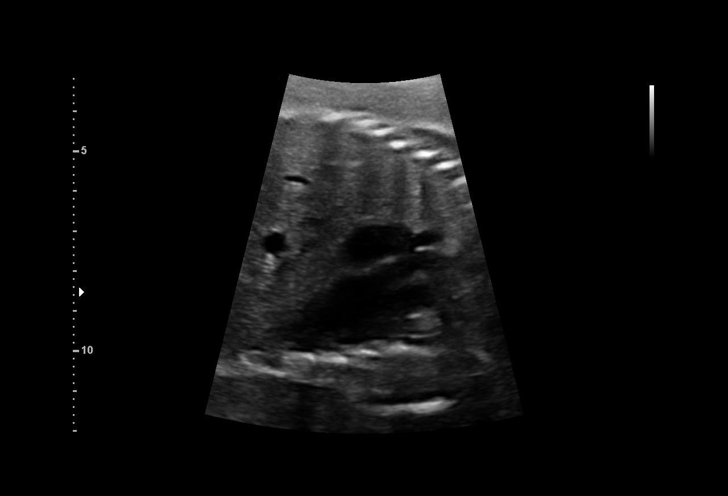
[im 29/46]
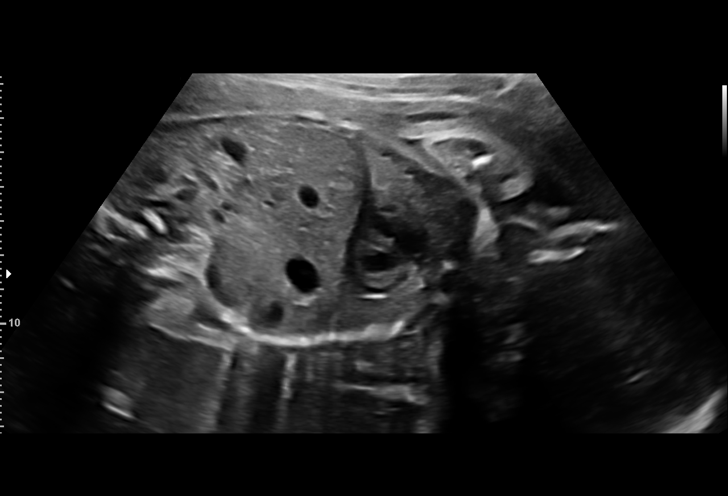
[im 32/46]
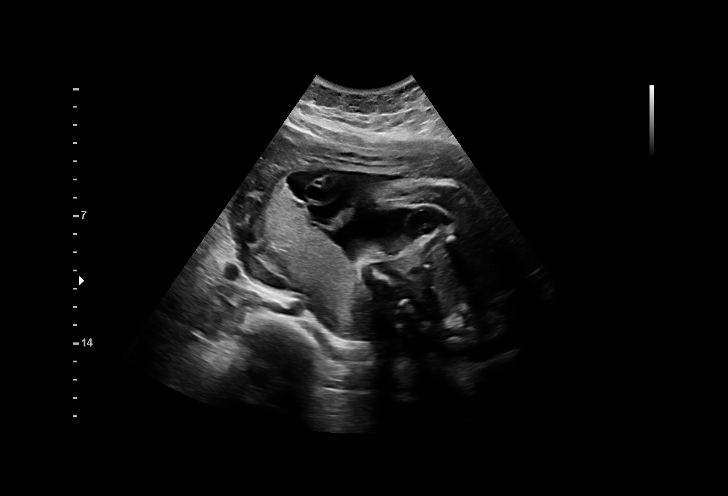
[im 36/46]
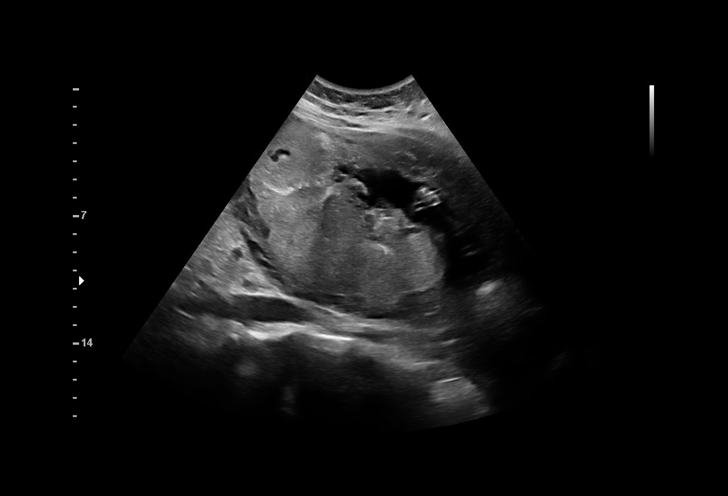
[im 39/46]
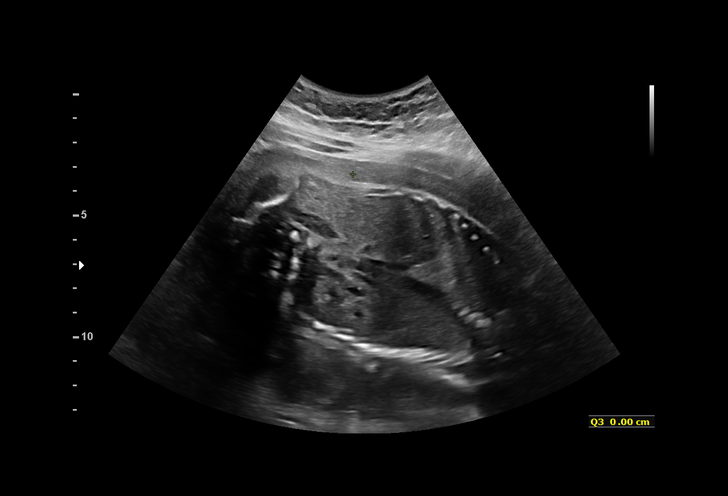
[im 42/46]
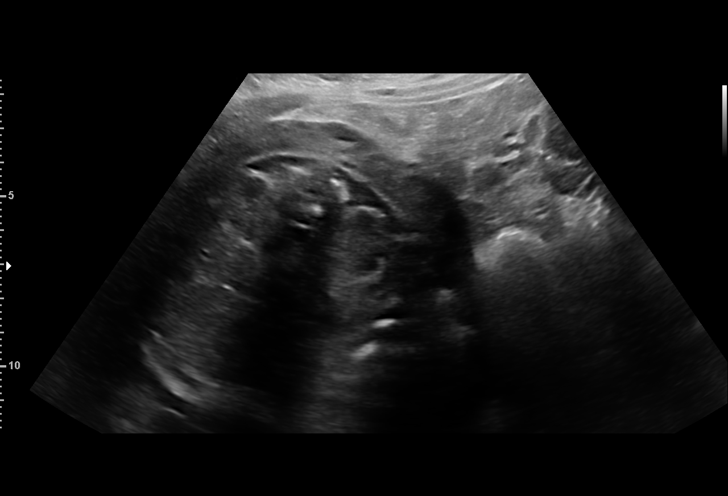
[im 46/46]
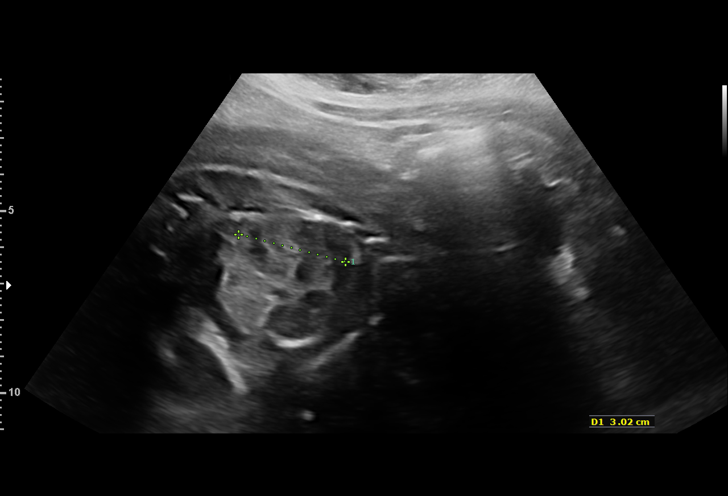

[14 of 28 positions shown; findings below may reference images not displayed]

1  JOSEL FAUST              [id number]      [id number]     [id number]
Indications

29 weeks gestation of pregnancy
Gestational diabetes in pregnancy, diet
controlled
Obesity complicating pregnancy, third
trimester
Encounter for other antenatal screening
follow-up
OB History

Blood Type:            Height:  5'3"   Weight (lb):  199       BMI:
Gravidity:    2          SAB:   1
Fetal Evaluation

Num Of Fetuses:     1
Fetal Heart         123
Rate(bpm):
Cardiac Activity:   Observed
Presentation:       Cephalic
Placenta:           Posterior, above cervical os
P. Cord Insertion:  Previously Visualized

Amniotic Fluid
AFI FV:      Subjectively within normal limits

AFI Sum(cm)     %Tile       Largest Pocket(cm)
9.49            8

RUQ(cm)       RLQ(cm)       LUQ(cm)        LLQ(cm)
3.6           2.57          3.32           0
Biometry

BPD:      71.6  mm     G. Age:  28w 5d         25  %    CI:        74.01   %    70 - 86
FL/HC:      19.6   %    19.6 -
HC:      264.3  mm     G. Age:  28w 5d         11  %    HC/AC:      1.05        0.99 -
AC:      250.8  mm     G. Age:  29w 2d         48  %    FL/BPD:     72.2   %    71 - 87
FL:       51.7  mm     G. Age:  27w 4d          6  %    FL/AC:      20.6   %    20 - 24
HUM:      46.8  mm     G. Age:  27w 4d         12  %

Est. FW:    7367  gm    2 lb 12 oz      42  %
Gestational Age

U/S Today:     28w 4d                                        EDD:   01/16/18
Best:          29w 1d     Det. By:  Early Ultrasound         EDD:   01/12/18
(07/05/17)
Anatomy

Cranium:               Appears normal         Aortic Arch:            Previously seen
Cavum:                 Previously seen        Ductal Arch:            Previously seen
Ventricles:            Appears normal         Diaphragm:              Appears normal
Choroid Plexus:        Previously seen        Stomach:                Appears normal, left
sided
Cerebellum:            Previously seen        Abdomen:                Previously seen
Posterior Fossa:       Previously seen        Abdominal Wall:         Previously seen
Nuchal Fold:           Previously seen        Cord Vessels:           Previously seen
Face:                  Orbits and profile     Kidneys:                Appear normal
previously seen
Lips:                  Previously seen        Bladder:                Appears normal
Thoracic:              Appears normal         Spine:                  Previously seen
Heart:                 Appears normal         Upper Extremities:      Previously seen
(4CH, axis, and situs
RVOT:                  Previously seen        Lower Extremities:      Previously seen
LVOT:                  Appears normal

Other:  Male gender. Heels visualized previously. Nasal bone visualized
previously. Technically difficult due to fetal position.
Cervix Uterus Adnexa

Cervix
Not visualized (advanced GA >23wks)
Impression

Singleton intrauterine pregnancy at 29 weeks 1 day gestation
with fetal cardiac activity
Cephalic presentation
Posterior placenta
Normal appearing fetal growth with an AFI > 9cm
Recommendations

Follow-up ultrasounds as clinically indicated.
